# Patient Record
Sex: Female | Born: 1935 | Race: White | Hispanic: No | Smoking: Never smoker
Health system: Southern US, Community
[De-identification: ages and names within clinical notes are randomized; demographics above are authoritative.]

## PROBLEM LIST (undated history)

## (undated) DIAGNOSIS — R531 Weakness: Secondary | ICD-10-CM

## (undated) DIAGNOSIS — I6522 Occlusion and stenosis of left carotid artery: Secondary | ICD-10-CM

## (undated) DIAGNOSIS — D649 Anemia, unspecified: Secondary | ICD-10-CM

## (undated) DIAGNOSIS — R609 Edema, unspecified: Secondary | ICD-10-CM

## (undated) DIAGNOSIS — I10 Essential (primary) hypertension: Secondary | ICD-10-CM

## (undated) DIAGNOSIS — D72829 Elevated white blood cell count, unspecified: Secondary | ICD-10-CM

## (undated) DIAGNOSIS — E871 Hypo-osmolality and hyponatremia: Secondary | ICD-10-CM

## (undated) HISTORY — DX: Hypo-osmolality and hyponatremia: E87.1

## (undated) HISTORY — DX: Anemia, unspecified: D64.9

## (undated) HISTORY — PX: ABDOMINAL HYSTERECTOMY: SHX81

## (undated) HISTORY — DX: Weakness: R53.1

## (undated) HISTORY — DX: Edema, unspecified: R60.9

## (undated) HISTORY — DX: Elevated white blood cell count, unspecified: D72.829

## (undated) HISTORY — DX: Essential (primary) hypertension: I10

## (undated) HISTORY — DX: Occlusion and stenosis of left carotid artery: I65.22

---

## 1997-10-08 ENCOUNTER — Other Ambulatory Visit: Admission: RE | Admit: 1997-10-08 | Discharge: 1997-10-08 | Payer: Self-pay | Admitting: Internal Medicine

## 1997-12-28 ENCOUNTER — Ambulatory Visit (HOSPITAL_COMMUNITY): Admission: RE | Admit: 1997-12-28 | Discharge: 1997-12-28 | Payer: Self-pay | Admitting: Internal Medicine

## 1997-12-28 ENCOUNTER — Encounter: Payer: Self-pay | Admitting: Internal Medicine

## 1999-04-04 ENCOUNTER — Encounter: Admission: RE | Admit: 1999-04-04 | Discharge: 1999-04-04 | Payer: Self-pay | Admitting: Internal Medicine

## 1999-04-14 ENCOUNTER — Ambulatory Visit (HOSPITAL_COMMUNITY): Admission: RE | Admit: 1999-04-14 | Discharge: 1999-04-14 | Payer: Self-pay | Admitting: Internal Medicine

## 1999-10-13 ENCOUNTER — Other Ambulatory Visit: Admission: RE | Admit: 1999-10-13 | Discharge: 1999-10-13 | Payer: Self-pay | Admitting: Internal Medicine

## 1999-10-13 LAB — HM PAP SMEAR

## 2003-11-26 ENCOUNTER — Encounter: Admission: RE | Admit: 2003-11-26 | Discharge: 2003-11-26 | Payer: Self-pay | Admitting: Internal Medicine

## 2003-12-02 ENCOUNTER — Ambulatory Visit (HOSPITAL_COMMUNITY): Admission: RE | Admit: 2003-12-02 | Discharge: 2003-12-02 | Payer: Self-pay | Admitting: Internal Medicine

## 2007-12-13 ENCOUNTER — Encounter: Admission: RE | Admit: 2007-12-13 | Discharge: 2007-12-13 | Payer: Self-pay | Admitting: Internal Medicine

## 2007-12-18 ENCOUNTER — Encounter: Payer: Self-pay | Admitting: Internal Medicine

## 2007-12-18 ENCOUNTER — Ambulatory Visit (HOSPITAL_COMMUNITY): Admission: RE | Admit: 2007-12-18 | Discharge: 2007-12-18 | Payer: Self-pay | Admitting: Internal Medicine

## 2007-12-18 ENCOUNTER — Ambulatory Visit: Payer: Self-pay | Admitting: Vascular Surgery

## 2008-01-09 ENCOUNTER — Encounter: Admission: RE | Admit: 2008-01-09 | Discharge: 2008-01-09 | Payer: Self-pay | Admitting: Internal Medicine

## 2008-04-03 ENCOUNTER — Ambulatory Visit: Payer: Self-pay | Admitting: Internal Medicine

## 2008-05-22 ENCOUNTER — Ambulatory Visit: Payer: Self-pay | Admitting: Internal Medicine

## 2008-07-03 ENCOUNTER — Ambulatory Visit: Payer: Self-pay | Admitting: Internal Medicine

## 2008-12-29 ENCOUNTER — Ambulatory Visit: Payer: Self-pay | Admitting: Internal Medicine

## 2008-12-31 ENCOUNTER — Encounter: Admission: RE | Admit: 2008-12-31 | Discharge: 2008-12-31 | Payer: Self-pay | Admitting: Internal Medicine

## 2008-12-31 LAB — HM MAMMOGRAPHY

## 2009-07-01 ENCOUNTER — Ambulatory Visit: Payer: Self-pay | Admitting: Internal Medicine

## 2009-08-09 ENCOUNTER — Ambulatory Visit: Payer: Self-pay | Admitting: Internal Medicine

## 2009-12-13 HISTORY — PX: EYE SURGERY: SHX253

## 2009-12-31 ENCOUNTER — Ambulatory Visit: Payer: Self-pay | Admitting: Internal Medicine

## 2010-01-10 ENCOUNTER — Ambulatory Visit: Payer: Self-pay | Admitting: Internal Medicine

## 2010-07-05 ENCOUNTER — Ambulatory Visit (INDEPENDENT_AMBULATORY_CARE_PROVIDER_SITE_OTHER): Payer: Medicare Other | Admitting: Internal Medicine

## 2010-07-05 DIAGNOSIS — I10 Essential (primary) hypertension: Secondary | ICD-10-CM

## 2010-07-05 DIAGNOSIS — F068 Other specified mental disorders due to known physiological condition: Secondary | ICD-10-CM

## 2011-01-02 ENCOUNTER — Encounter: Payer: Self-pay | Admitting: Internal Medicine

## 2011-01-03 ENCOUNTER — Ambulatory Visit: Payer: Medicare Other | Admitting: Internal Medicine

## 2011-01-03 ENCOUNTER — Ambulatory Visit (INDEPENDENT_AMBULATORY_CARE_PROVIDER_SITE_OTHER): Payer: Medicare Other | Admitting: Internal Medicine

## 2011-01-03 ENCOUNTER — Encounter: Payer: Self-pay | Admitting: Internal Medicine

## 2011-01-03 DIAGNOSIS — I6522 Occlusion and stenosis of left carotid artery: Secondary | ICD-10-CM | POA: Insufficient documentation

## 2011-01-03 DIAGNOSIS — M1990 Unspecified osteoarthritis, unspecified site: Secondary | ICD-10-CM

## 2011-01-03 DIAGNOSIS — I10 Essential (primary) hypertension: Secondary | ICD-10-CM | POA: Insufficient documentation

## 2011-01-03 DIAGNOSIS — I6529 Occlusion and stenosis of unspecified carotid artery: Secondary | ICD-10-CM

## 2011-01-03 NOTE — Progress Notes (Signed)
  Subjective:    Patient ID: Brooke Boyd, female    DOB: 07-Aug-1917, 77 y.o.   MRN: [id number]  HPI  patient with history of hypertension, 40-60% left carotid stenosis, perhaps mild dementia frequently repeating herself in today for six-month recheck. She is on generic Plendil 10 mg daily. Takes a baby aspirin daily. Used to take HCTZ 25 mg daily and potassium supplement but that was discontinued in August 2011. Had Zostavax vaccine August 2011. Refuses colonoscopy. Declines influenza immunization. Had Pneumovax vaccine February 2011. Last tetanus immunization may 1994.  Has a living will we'll. No known drug allergies. Last mammogram 2009 and has refused further mammography. Hysterectomy without oophorectomy around 1967. Cataract extraction August 2011 history of osteoarthritis of her knee and has had it injected by Dr. supple in 2010. Ambulates with a walker. Lives in an apartment complex in by her son. Has help from [employer] every other week for 3 hours.    Review of Systems     Objective:   Physical Exam chest clear to auscultation, cardiac exam: Regular rate and rhythm, normal S1 and S2, extremities without edema. She is alert and oriented x3. She knows the president of the [place], day of week, month and year. However she repeats herself frequently.        Assessment & Plan:  Hypertension-stable on generic Plendil daily  Osteoarthritis-stable ambulates with walker  Probable mild dementia with repeating herself frequently  Return in 6 months. She really wishes to have very little done in terms of health maintenance he declines influenza immunization. Accompanied by her son today.

## 2011-07-04 ENCOUNTER — Ambulatory Visit (INDEPENDENT_AMBULATORY_CARE_PROVIDER_SITE_OTHER): Payer: Medicare Other | Admitting: Internal Medicine

## 2011-07-04 ENCOUNTER — Encounter: Payer: Self-pay | Admitting: Internal Medicine

## 2011-07-04 DIAGNOSIS — R609 Edema, unspecified: Secondary | ICD-10-CM | POA: Diagnosis not present

## 2011-07-04 DIAGNOSIS — Z23 Encounter for immunization: Secondary | ICD-10-CM | POA: Diagnosis not present

## 2011-07-04 DIAGNOSIS — I10 Essential (primary) hypertension: Secondary | ICD-10-CM

## 2011-07-04 DIAGNOSIS — R5381 Other malaise: Secondary | ICD-10-CM

## 2011-07-04 DIAGNOSIS — R5383 Other fatigue: Secondary | ICD-10-CM | POA: Diagnosis not present

## 2011-07-04 DIAGNOSIS — H9190 Unspecified hearing loss, unspecified ear: Secondary | ICD-10-CM | POA: Insufficient documentation

## 2011-07-04 DIAGNOSIS — M1990 Unspecified osteoarthritis, unspecified site: Secondary | ICD-10-CM

## 2011-07-04 NOTE — Progress Notes (Signed)
Addended by: Judy Pimple on: 07/04/2011 04:41 PM   Modules accepted: Orders

## 2011-07-04 NOTE — Progress Notes (Signed)
  Subjective:    Patient ID: Brooke Boyd, female    DOB: [birth year], 78 y.o.   MRN: [id number]  HPI 78 year old white female in today for six-month recheck. History of hypertension, dependent edema, osteoarthritis. Accompanied by her son today. She lives independently in an apartment. Has been there for 10 years. Does well. Has someone to clean her house. She repeats herself quite a bit but she is alert& oriented x3. Recent issues with hearing loss but reluctant to get hearing aids. Did not want to get influenza immunization. Does have significant issues with dependent edema. Explained her shoes keep her feet elevated. Has been on diuretic in the past but was unable to tolerate that because of low sodium. Son is concerned about lesion on her left lateral leg that has been present for some time. Her skin is quite dry. Patient ambulates with a walker. Has not fallen. Manages her medications and lives independently. Both of her sons are supportive. History of left carotid bruit. Does take baby aspirin daily.    Review of Systems     Objective:   Physical Exam chest clear to auscultation; cardiac exam regular rate and rhythm normal S1 and S2; 1+ pitting edema of lower extremities. Skin is very dry. Has Heberden's and Bouchard's nodes of both hands consistent with osteoarthritis. Has a apparent hematoma left lateral leg which apparently has been there for some time. This could also be senile purpura. She has changes of stasis dermatitis both [place]. Her skin is quite thin.          Assessment & Plan:  Hypertension  History of left carotid bruit  Dependent edema  Hearing loss  Osteoarthritis  Senile purpura  Plan: Patient will return in 6 months for brief physical exam. Tdap vaccine given today. Declines colonoscopy. Declines bone density study.

## 2011-07-04 NOTE — Patient Instructions (Signed)
You had been given Tdap vaccine today. Lab work was drawn for thyroid functions as well as electrolytes. Please keep feet elevated as much as possible to help with lower extremity edema. Continue same medications. Please consider hearing aid. Return in 6 months.

## 2011-07-05 LAB — CBC WITH DIFFERENTIAL/PLATELET
Basophils Absolute: 0 10*3/uL (ref 0.0–0.1)
Basophils Relative: 0 % (ref 0–1)
MCHC: 32.7 g/dL (ref 30.0–36.0)
Monocytes Absolute: 0.6 10*3/uL (ref 0.1–1.0)
Neutro Abs: 3.8 10*3/uL (ref 1.7–7.7)
Neutrophils Relative %: 53 % (ref 43–77)
Platelets: 317 10*3/uL (ref 150–400)
RDW: 12.8 % (ref 11.5–15.5)
WBC: 7.3 10*3/uL (ref 4.0–10.5)

## 2011-07-05 LAB — COMPREHENSIVE METABOLIC PANEL
ALT: 12 U/L (ref 0–35)
AST: 20 U/L (ref 0–37)
Albumin: 3.7 g/dL (ref 3.5–5.2)
Alkaline Phosphatase: 87 U/L (ref 39–117)
BUN: 26 mg/dL — ABNORMAL HIGH (ref 6–23)
Potassium: 4.3 mEq/L (ref 3.5–5.3)
Sodium: 134 mEq/L — ABNORMAL LOW (ref 135–145)

## 2011-10-26 ENCOUNTER — Other Ambulatory Visit: Payer: Self-pay

## 2011-10-26 MED ORDER — FELODIPINE ER 10 MG PO TB24: 10.0000 mg | ORAL_TABLET | Freq: Every day | ORAL | Status: DC

## 2012-01-01 ENCOUNTER — Other Ambulatory Visit: Payer: Medicare Other | Admitting: Internal Medicine

## 2012-01-01 DIAGNOSIS — M810 Age-related osteoporosis without current pathological fracture: Secondary | ICD-10-CM | POA: Diagnosis not present

## 2012-01-01 DIAGNOSIS — I10 Essential (primary) hypertension: Secondary | ICD-10-CM

## 2012-01-01 DIAGNOSIS — Z79899 Other long term (current) drug therapy: Secondary | ICD-10-CM

## 2012-01-01 LAB — COMPREHENSIVE METABOLIC PANEL
ALT: 10 U/L (ref 0–35)
AST: 19 U/L (ref 0–37)
Albumin: 4 g/dL (ref 3.5–5.2)
CO2: 26 mEq/L (ref 19–32)
Calcium: 8.8 mg/dL (ref 8.4–10.5)
Chloride: 102 mEq/L (ref 96–112)
Creat: 0.62 mg/dL (ref 0.50–1.10)
Potassium: 4.3 mEq/L (ref 3.5–5.3)
Sodium: 139 mEq/L (ref 135–145)
Total Protein: 7 g/dL (ref 6.0–8.3)

## 2012-01-01 LAB — LIPID PANEL: Cholesterol: 170 mg/dL (ref 0–200)

## 2012-01-01 LAB — CBC WITH DIFFERENTIAL/PLATELET
Eosinophils Relative: 2 % (ref 0–5)
Lymphocytes Relative: 46 % (ref 12–46)
Lymphs Abs: 2.9 10*3/uL (ref 0.7–4.0)
MCV: 90.8 fL (ref 78.0–100.0)
Neutro Abs: 2.7 10*3/uL (ref 1.7–7.7)
Platelets: 310 10*3/uL (ref 150–400)
RBC: 4 MIL/uL (ref 3.87–5.11)
WBC: 6.3 10*3/uL (ref 4.0–10.5)

## 2012-01-01 LAB — TSH: TSH: 2.7 u[IU]/mL (ref 0.350–4.500)

## 2012-01-02 ENCOUNTER — Encounter: Payer: Self-pay | Admitting: Internal Medicine

## 2012-01-02 ENCOUNTER — Ambulatory Visit (INDEPENDENT_AMBULATORY_CARE_PROVIDER_SITE_OTHER): Payer: Medicare Other | Admitting: Internal Medicine

## 2012-01-02 VITALS — BP 134/78 | HR 96 | Temp 99.1°F | Ht 59.5 in | Wt 144.0 lb

## 2012-01-02 DIAGNOSIS — Z0000 Encounter for general adult medical examination without abnormal findings: Secondary | ICD-10-CM | POA: Diagnosis not present

## 2012-01-02 DIAGNOSIS — H9319 Tinnitus, unspecified ear: Secondary | ICD-10-CM

## 2012-01-02 LAB — POCT URINALYSIS DIPSTICK
Bilirubin, UA: NEGATIVE
Glucose, UA: NEGATIVE
Leukocytes, UA: NEGATIVE
Nitrite, UA: NEGATIVE
Urobilinogen, UA: NEGATIVE

## 2012-01-02 LAB — VITAMIN D 25 HYDROXY (VIT D DEFICIENCY, FRACTURES): Vit D, 25-Hydroxy: 40 ng/mL (ref 30–89)

## 2012-01-02 NOTE — Progress Notes (Signed)
Subjective:    Patient ID: Brooke Boyd, female    DOB: 1917-05-23, 78 y.o.   MRN: [id number]  HPI 78 year old white female with history of hypertension in today for health maintenance exam. She currently lives independently wearing a medic alert device and family members checking on her frequently. She really doesn't appear much food. She has a microwave at home but doesn't really couldn't for herself anymore. Family members bring in food for her. She has [employer] come every 2 weeks to clean her home. She is only had one fall in the past year and that was when she got off balance with her walker and turned it over. She was able to right herself and her walker by herself without any assistance and did not injure herself.  History of left carotid stenosis of 40-60% in 2001. Had similar study 2005 but declined having further carotid Dopplers in 2008. Last mammogram was July 2010 and has refused further mammography. Had Zostavax vaccine August 2011. Had Pneumovax vaccine February 2011. Not sure she had influenza immunization last year. Had tetanus immunization 09/23/1992.  Has had issues with right knee and has had one steroid injection. Ambulates with a walker. She used to be on HCTZ but became hyponatremic so that was discontinued.   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 Completed one year of college. Does not smoke. Very rarely consumes a glass of wine.  Family history: Father died at age 78 of Parkinson's disease. Mother died at age 72 of an MI. One brother died at age 44 of colon cancer. One brother died at age 56 of complications of epilepsy. One sister with history of colon cancer. 2 adult sons in good health. Mother had hypertension.    Review of Systems  Constitutional: Negative.   HENT: Negative.   Eyes: Negative.   Respiratory: Negative.   Cardiovascular: Negative.   Gastrointestinal: Negative.   Neurological:       Very chatty,  complains of tinnitus left ear  Hematological: Negative.   Psychiatric/Behavioral:       Alert and oriented x3       Objective:   Physical Exam  Vitals reviewed. Constitutional: She is oriented to person, place, and time. She appears well-developed and well-nourished. No distress.  HENT:  Head: Normocephalic and atraumatic.  Right Ear: External ear normal.  Left Ear: External ear normal.  Nose: Nose normal.  Mouth/Throat: Oropharynx is clear and moist. No oropharyngeal exudate.  Eyes: Conjunctivae and EOM are normal. Pupils are equal, round, and reactive to light. Right eye exhibits no discharge. Left eye exhibits no discharge. No scleral icterus.  Neck: Neck supple. No JVD present. No thyromegaly present.  Cardiovascular: Normal rate, regular rhythm, normal heart sounds and intact distal pulses.   No murmur heard. Pulmonary/Chest: Breath sounds normal. No respiratory distress. She has no wheezes. She exhibits no tenderness.       Breasts exam declined  Abdominal: Bowel sounds are normal. She exhibits no mass. There is no tenderness. There is no rebound and no guarding.  Genitourinary:       Deferred  Musculoskeletal: She exhibits edema.       1+ pitting edema lower extremities and multiple superficial varicosities  Neurological: She is oriented to person, place, and time. She has normal reflexes. No cranial nerve deficit.       Ambulates slowly with a walker  Skin: Skin is warm and dry. No rash noted. She is not diaphoretic.  Psychiatric: She has a normal mood and affect. Her behavior is normal. Judgment and thought content normal.          Assessment & Plan:  Hypertension  Tinnitus-recommend Lipoflavinoid over-the-counter  Plan: Return in one year or as needed. I am comfortable with her living independently using a med alert device Subjective:   Patient presents for Medicare Annual/Subsequent preventive examination.   Review Past Medical/Family/Social: See above  history   Risk Factors  Current exercise habits: unable to exercise Dietary issues discussed: No dietary concerns  Cardiac risk factors:   Depression Screen  (Note: if answer to either of the following is "Yes", a more complete depression screening is indicated)  Over the past two weeks, have you felt down, depressed or hopeless? no Over the past two weeks, have you felt little interest or pleasure in doing things? no Have you lost interest or pleasure in daily life? no Do you often feel hopeless? no Do you cry easily over simple problems? No   Activities of Daily Living  In your present state of health, do you have any difficulty performing the following activities?:  Driving? yes Managing money? yes Feeding yourself? No  Getting from bed to chair? No  Climbing a flight of stairs? yes Preparing food and eating?: No  Bathing or showering? No - sponge bath Getting dressed: No  Getting to the toilet? No  Using the toilet:No  Moving around from place to place: No  In the past year have you fallen or had a near fall?: one fall in past year with walker- lost balance Are you sexually active? No  Do you have more than one partner? No   Hearing Difficulties: To have hearing test this fall Do you often ask people to speak up or repeat themselves? No  Do you experience ringing or noises in your ears?  yes  Do you have difficulty understanding soft or whispered voices? yes Do you feel that you have a problem with memory? No Do you often misplace items? No  Do you feel safe at home? Yes   Cognitive Testing  Alert? Yes Normal Appearance?Yes  Oriented to person? Yes Place? Yes  Time? Yes  Recall of three objects? Yes  Can perform simple calculations? Yes  Displays appropriate judgment?Yes  Can read the correct time from a watch face?Yes   List the Names of Other Physician/Practitioners you currently use: No   Indicate any recent Medical Services you may have received from  other than [facility] providers in the past year (date may be approximate). None  Screening Tests / Date Colonoscopy  -declined                   Zostavax  Mammogram-declined  Influenza Vaccine  Tetanus/tdap   Objective:       See EPIC Vital Signs    General appearance: Appears younger than stated age  Head: Normocephalic, without obvious abnormality, atraumatic  Eyes: conj clear, EOMi PEERLA  Ears: normal TM's and external ear canals both ears  Nose: Nares normal. Septum midline. Mucosa normal. No drainage or sinus tenderness.  Throat: lips, mucosa, and tongue normal; teeth and gums normal  Neck: no adenopathy, no carotid bruit, no JVD, supple, symmetrical, trachea midline and thyroid not enlarged, symmetric, no tenderness/mass/nodules  No CVA tenderness.  Lungs: clear to auscultation bilaterally  Breasts: Declined Heart: regular rate and rhythm, S1, S2 normal, no murmur, click, rub or gallop  Abdomen: soft, non-tender; bowel sounds normal; no masses,  no organomegaly  Musculoskeletal: ROM normal in all joints, no crepitus, no deformity, Normal muscle strengthen. Back  is symmetric, no curvature. Skin: Skin color, texture, turgor normal. No rashes or lesions  Lymph nodes: Cervical, supraclavicular, and axillary nodes normal.  Neurologic: CN 2 -12 Normal, Normal symmetric reflexes. Normal coordination and gait  Psych: Alert & Oriented x 3, Mood appear stable.    Assessment:    Annual wellness medicare exam   Plan:    During the course of the visit the patient was educated and counseled about appropriate screening and preventive services including:  Screening mammography which she refuses Colorectal cancer screening which she refuses  Screen + for depression is negative    Diet review for nutrition referral? Yes ____ Not Indicated __x__  Patient Instructions (the written plan) was given to the patient.  Medicare Attestation  I have personally reviewed:  The patient's  medical and social history  Their use of alcohol, tobacco or illicit drugs  Their current medications and supplements  The patient's functional ability including ADLs,fall risks, home safety risks, cognitive, and hearing and visual impairment  Diet and physical activities  Evidence for depression or mood disorders  The patient's weight, height, BMI, and visual acuity have been recorded in the chart. I have made referrals, counseling, and provided education to the patient based on review of the above and I have provided the patient with a written personalized care plan for preventive services.

## 2012-01-02 NOTE — Patient Instructions (Addendum)
Continue same meds and return in one year. 

## 2012-01-04 ENCOUNTER — Encounter: Payer: Medicare Other | Admitting: Internal Medicine

## 2012-04-16 DIAGNOSIS — H903 Sensorineural hearing loss, bilateral: Secondary | ICD-10-CM | POA: Diagnosis not present

## 2012-10-14 ENCOUNTER — Other Ambulatory Visit: Payer: Self-pay

## 2012-10-14 MED ORDER — FELODIPINE ER 10 MG PO TB24: 10.0000 mg | ORAL_TABLET | Freq: Every day | ORAL | Status: DC

## 2013-01-06 ENCOUNTER — Other Ambulatory Visit: Payer: Medicare Other | Admitting: Internal Medicine

## 2013-01-06 DIAGNOSIS — M810 Age-related osteoporosis without current pathological fracture: Secondary | ICD-10-CM | POA: Diagnosis not present

## 2013-01-06 DIAGNOSIS — Z130 Encounter for screening for diseases of the blood and blood-forming organs and certain disorders involving the immune mechanism: Secondary | ICD-10-CM

## 2013-01-06 DIAGNOSIS — Z1329 Encounter for screening for other suspected endocrine disorder: Secondary | ICD-10-CM

## 2013-01-06 DIAGNOSIS — I10 Essential (primary) hypertension: Secondary | ICD-10-CM | POA: Diagnosis not present

## 2013-01-06 DIAGNOSIS — Z13220 Encounter for screening for lipoid disorders: Secondary | ICD-10-CM

## 2013-01-06 LAB — COMPREHENSIVE METABOLIC PANEL
Albumin: 4.2 g/dL (ref 3.5–5.2)
Alkaline Phosphatase: 75 U/L (ref 39–117)
BUN: 16 mg/dL (ref 6–23)
Creat: 0.64 mg/dL (ref 0.50–1.10)
Glucose, Bld: 90 mg/dL (ref 70–99)
Potassium: 4.1 mEq/L (ref 3.5–5.3)

## 2013-01-06 LAB — CBC WITH DIFFERENTIAL/PLATELET
Basophils Relative: 0 % (ref 0–1)
Eosinophils Absolute: 0.1 10*3/uL (ref 0.0–0.7)
Hemoglobin: 12.1 g/dL (ref 12.0–15.0)
Lymphs Abs: 3.2 10*3/uL (ref 0.7–4.0)
MCH: 30.9 pg (ref 26.0–34.0)
Monocytes Relative: 8 % (ref 3–12)
Neutro Abs: 3.3 10*3/uL (ref 1.7–7.7)
Neutrophils Relative %: 45 % (ref 43–77)
Platelets: 329 10*3/uL (ref 150–400)
RBC: 3.91 MIL/uL (ref 3.87–5.11)

## 2013-01-06 LAB — LIPID PANEL
HDL: 58 mg/dL (ref >39)
LDL Cholesterol: 90 mg/dL (ref 0–99)
Total CHOL/HDL Ratio: 2.9 Ratio
Triglycerides: 107 mg/dL (ref <150)

## 2013-01-07 ENCOUNTER — Encounter: Payer: Medicare Other | Admitting: Internal Medicine

## 2013-01-09 ENCOUNTER — Encounter: Payer: Self-pay | Admitting: Internal Medicine

## 2013-01-09 ENCOUNTER — Ambulatory Visit (INDEPENDENT_AMBULATORY_CARE_PROVIDER_SITE_OTHER): Payer: Medicare Other | Admitting: Internal Medicine

## 2013-01-09 VITALS — BP 132/64 | HR 88 | Temp 98.7°F | Ht 59.0 in | Wt 134.0 lb

## 2013-01-09 DIAGNOSIS — M19019 Primary osteoarthritis, unspecified shoulder: Secondary | ICD-10-CM | POA: Diagnosis not present

## 2013-01-09 DIAGNOSIS — H9190 Unspecified hearing loss, unspecified ear: Secondary | ICD-10-CM

## 2013-01-09 DIAGNOSIS — M1710 Unilateral primary osteoarthritis, unspecified knee: Secondary | ICD-10-CM | POA: Diagnosis not present

## 2013-01-09 DIAGNOSIS — H9193 Unspecified hearing loss, bilateral: Secondary | ICD-10-CM

## 2013-01-09 DIAGNOSIS — Z23 Encounter for immunization: Secondary | ICD-10-CM | POA: Diagnosis not present

## 2013-01-09 DIAGNOSIS — I10 Essential (primary) hypertension: Secondary | ICD-10-CM

## 2013-01-09 DIAGNOSIS — M170 Bilateral primary osteoarthritis of knee: Secondary | ICD-10-CM

## 2013-01-09 DIAGNOSIS — R8290 Unspecified abnormal findings in urine: Secondary | ICD-10-CM

## 2013-01-09 DIAGNOSIS — R82998 Other abnormal findings in urine: Secondary | ICD-10-CM | POA: Diagnosis not present

## 2013-01-09 DIAGNOSIS — F0390 Unspecified dementia without behavioral disturbance: Secondary | ICD-10-CM

## 2013-01-09 LAB — POCT URINALYSIS DIPSTICK
Blood, UA: NEGATIVE
Glucose, UA: NEGATIVE
Ketones, UA: NEGATIVE
Spec Grav, UA: 1.02

## 2013-01-09 NOTE — Progress Notes (Signed)
Subjective:    Patient ID: Brooke Boyd, female    DOB: 02/23/1918, 79 y.o.   MRN: [id number]  HPI  79 year old White female for health maintenance and evaluation of medical problems. Continues to live alone in an apartment owned by one of her sons. She wears a medic alert device and family members check on her frequently. She has [employer] come to clean her home. One fall in the past year. Was not injured in fall. Fall was inside her home near her closet. She has a microwave at home but doesn't use stove anymore. Family brings her food. No longer drives. Accompanied today by her son.  Past medical history: History of left carotid stenosis of 40-60% in 2001. Had similar study in 2005 but declined having further carotid Dopplers in 2008. Last mammogram was July 2010 and has refused further mammography.  Had Zostavax vaccine August 2011. Pneumovax vaccine February 2011. Tetanus immunization 1994.  Has had issues with right knee osteoarthritis and has had one steroid injection. Ambulates with a walker.  She used to be on HCTZ for hypertension but became hyponatremic so that was discontinued. Has had some issues with right shoulder and is seen orthopedist and had steroid injection. Did not help very much.  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 Completed one year of college. Does not smoke. Very rarely consumes a glass of wine.  Family history: Father died at age 29 of Parkinson's disease. Mother died at age 61 of an MI. One brother died at age 62 of colon cancer. One brother died at age 23 of complications of epilepsy. One sister with history of colon cancer. 2 adult sons in good health. Mother had hypertension.    Review of Systems  Constitutional: Negative.   HENT: Negative.   Eyes: Negative.   Respiratory: Negative.   Cardiovascular: Negative.   Gastrointestinal: Negative.   Endocrine: Negative.   Genitourinary: Negative.    Musculoskeletal:       Right shoulder pain and knee pain  Skin:       Abraded areas on scalp  Allergic/Immunologic: Negative.   Neurological: Negative.   Hematological: Negative.   Psychiatric/Behavioral: Negative.        Objective:   Physical Exam  Vitals reviewed. Constitutional: She is oriented to person, place, and time. She appears well-developed and well-nourished. No distress.  HENT:  Head: Normocephalic and atraumatic.  Right Ear: External ear normal.  Left Ear: External ear normal.  Mouth/Throat: Oropharynx is clear and moist. No oropharyngeal exudate.  Cerumen in right ear canal  Eyes: Conjunctivae are normal. Right eye exhibits no discharge. Left eye exhibits no discharge. No scleral icterus.  Neck: Neck supple. No JVD present. No thyromegaly present.  Cardiovascular: Normal rate, regular rhythm, normal heart sounds and intact distal pulses.   No murmur heard. Pulmonary/Chest: Effort normal and breath sounds normal. No respiratory distress. She has no wheezes. She has no rales. She exhibits no tenderness.  Breasts without masses  Abdominal: Soft. Bowel sounds are normal. She exhibits no distension and no mass. There is no tenderness. There is no rebound and no guarding.  Genitourinary:  Deferred due to age  Musculoskeletal: She exhibits no edema.  Lymphadenopathy:    She has no cervical adenopathy.  Neurological: She is alert and oriented to person, place, and time. She has normal reflexes. She displays normal reflexes. No cranial nerve deficit. Coordination normal.  Skin: Skin is warm and dry.  No rash noted. She is not diaphoretic.  Abraded areas on scalp  Psychiatric: She has a normal mood and affect. Her behavior is normal. Judgment and thought content normal.  Repeats herself a lot in conversation          Assessment & Plan:  HTN-stable Hearing loss followed by ENT Osteoarthritis-stable ambulates with a walker. Only one minor fall in past year in  apartment Health maintenance did agree to Influenza immunization but declines mammogram and colon cancer screening at her age which is reasonable Right shoulder arthropathy- has seen ortho in past and had steroid injection which did not help much Scalp abrasions- etiology unclear Mild dementia- son reports she forgets sometimes but still can reconcile a bank statement. Did not recall where my office was but has not been here in a year. Repeats herself a lot in conversation. Lives alone and seems safe with help from family and [employer]. Plan: RTC one year or prn. Form signed for handicapped parking permit. Son accompanies her today. Was told if abraded areas on scalp do not heal within the next month to have Dermatology evaluation.  Subjective:   Patient presents for Medicare Annual/Subsequent preventive examination.   Review Past Medical/Family/Social: see above   Risk Factors  Current exercise habits: sedentary Dietary issues discussed: low fat low carb  Cardiac risk factors: HTN  Depression Screen  (Note: if answer to either of the following is "Yes", a more complete depression screening is indicated)   Over the past two weeks, have you felt down, depressed or hopeless? No  Over the past two weeks, have you felt little interest or pleasure in doing things? No Have you lost interest or pleasure in daily life? No Do you often feel hopeless? No Do you cry easily over simple problems? No   Activities of Daily Living  In your present state of health, do you have any difficulty performing the following activities?:   Driving? Not driving Managing money? No  Can reconcile bank statement Feeding yourself? No  Getting from bed to chair? No  Climbing a flight of stairs? No  Preparing food and eating?: No  Bathing or showering? No  Getting dressed: No  Getting to the toilet? No  Using the toilet:No  Moving around from place to place: No  In the past year have you fallen or had  a near fall?:No  Are you sexually active? No  Do you have more than one partner? No   Hearing Difficulties: yes Do you often ask people to speak up or repeat themselves? yes  Do you experience ringing or noises in your ears? Not any more  Do you have difficulty understanding soft or whispered voices? yes Do you feel that you have a problem with memory? some Do you often misplace items? sometimes   Home Safety:  Do you have a smoke alarm at your residence? Yes Do you have grab bars in the bathroom? yes Do you have throw rugs in your house? yes   Cognitive Testing  Alert? Yes Normal Appearance?Yes  Oriented to person? Yes Place? Yes  Time? Yes  Recall of three objects? Yes  Can perform simple calculations? Yes  Displays appropriate judgment?Yes  Can read the correct time from a watch face?Yes   List the Names of Other Physician/Practitioners you currently use:  See referral list for the physicians patient is currently seeing. Does not wear hearing aids. Sees ENT for hearing.    Review of Systems: see above  Objective:     General appearance: Appears stated age. Head: Normocephalic, without obvious abnormality, atraumatic  Eyes: conj clear, EOMi PEERLA  Ears: normal TM's and external ear canals both ears  Nose: Nares normal. Septum midline. Mucosa normal. No drainage or sinus tenderness.  Throat: lips, mucosa, and tongue normal; teeth and gums normal  Neck: no adenopathy, no carotid bruit, no JVD, supple, symmetrical, trachea midline and thyroid not enlarged, symmetric, no tenderness/mass/nodules  No CVA tenderness.  Lungs: clear to auscultation bilaterally  Breasts: normal appearance, no masses or tenderness. Heart: regular rate and rhythm, S1, S2 normal, no murmur, click, rub or gallop  Abdomen: soft, non-tender; bowel sounds normal; no masses, no organomegaly  Musculoskeletal: ROM normal in all joints, no crepitus, no deformity, Normal muscle strengthen. Back  is  symmetric, no curvature. Skin: Skin color, texture, turgor normal. Abraded areas on scalp Lymph nodes: Cervical, supraclavicular, and axillary nodes normal.  Neurologic: CN 2 -12 Normal, Normal symmetric reflexes. Normal coordination and gait  Psych: Alert & Oriented x 3, Mood appear stable.    Assessment:    Annual wellness medicare exam   Plan:    During the course of the visit the patient was educated and counseled about appropriate screening and preventive services including:   Declines mammogram Did agree to Influenza vaccine this year Do not pursue colon cancer screening at her age- she does not want to do hemoccult cards and has declined colonoscopy previously     Patient Instructions (the written plan) was given to the patient.  Medicare Attestation  I have personally reviewed:  The patient's medical and social history  Their use of alcohol, tobacco or illicit drugs  Their current medications and supplements  The patient's functional ability including ADLs,fall risks, home safety risks, cognitive, and hearing and visual impairment  Diet and physical activities  Evidence for depression or mood disorders  The patient's weight, height, BMI, and visual acuity have been recorded in the chart. I have made referrals, counseling, and provided education to the patient based on review of the above and I have provided the patient with a written personalized care plan for preventive services.

## 2013-01-09 NOTE — Patient Instructions (Addendum)
Continue same meds and return in one year.   Influenza vaccine given today.

## 2013-01-10 LAB — URINE CULTURE: Colony Count: 8000

## 2013-01-11 ENCOUNTER — Encounter: Payer: Self-pay | Admitting: Internal Medicine

## 2013-01-15 NOTE — Progress Notes (Signed)
Patient's family informed.

## 2013-02-11 DIAGNOSIS — L738 Other specified follicular disorders: Secondary | ICD-10-CM | POA: Diagnosis not present

## 2013-02-11 DIAGNOSIS — D044 Carcinoma in situ of skin of scalp and neck: Secondary | ICD-10-CM | POA: Diagnosis not present

## 2013-02-11 DIAGNOSIS — D485 Neoplasm of uncertain behavior of skin: Secondary | ICD-10-CM | POA: Diagnosis not present

## 2013-03-24 DIAGNOSIS — D044 Carcinoma in situ of skin of scalp and neck: Secondary | ICD-10-CM | POA: Diagnosis not present

## 2013-04-23 DIAGNOSIS — H903 Sensorineural hearing loss, bilateral: Secondary | ICD-10-CM | POA: Diagnosis not present

## 2013-07-15 DIAGNOSIS — D044 Carcinoma in situ of skin of scalp and neck: Secondary | ICD-10-CM | POA: Diagnosis not present

## 2014-01-13 ENCOUNTER — Other Ambulatory Visit: Payer: Medicare Other | Admitting: Internal Medicine

## 2014-01-13 DIAGNOSIS — Z130 Encounter for screening for diseases of the blood and blood-forming organs and certain disorders involving the immune mechanism: Secondary | ICD-10-CM | POA: Diagnosis not present

## 2014-01-13 DIAGNOSIS — Z1329 Encounter for screening for other suspected endocrine disorder: Secondary | ICD-10-CM

## 2014-01-13 DIAGNOSIS — Z13220 Encounter for screening for lipoid disorders: Secondary | ICD-10-CM | POA: Diagnosis not present

## 2014-01-13 DIAGNOSIS — I10 Essential (primary) hypertension: Secondary | ICD-10-CM | POA: Diagnosis not present

## 2014-01-13 LAB — LIPID PANEL
Cholesterol: 161 mg/dL (ref 0–200)
HDL: 58 mg/dL (ref >39)
LDL CALC: 86 mg/dL (ref 0–99)
Total CHOL/HDL Ratio: 2.8 Ratio
Triglycerides: 83 mg/dL (ref <150)
VLDL: 17 mg/dL (ref 0–40)

## 2014-01-13 LAB — COMPREHENSIVE METABOLIC PANEL
ALT: 9 U/L (ref 0–35)
AST: 17 U/L (ref 0–37)
Albumin: 3.7 g/dL (ref 3.5–5.2)
Alkaline Phosphatase: 75 U/L (ref 39–117)
BUN: 11 mg/dL (ref 6–23)
CALCIUM: 8.8 mg/dL (ref 8.4–10.5)
CHLORIDE: 99 meq/L (ref 96–112)
CO2: 28 meq/L (ref 19–32)
CREATININE: 0.64 mg/dL (ref 0.50–1.10)
Glucose, Bld: 88 mg/dL (ref 70–99)
POTASSIUM: 4 meq/L (ref 3.5–5.3)
SODIUM: 134 meq/L — AB (ref 135–145)
TOTAL PROTEIN: 6.6 g/dL (ref 6.0–8.3)
Total Bilirubin: 0.4 mg/dL (ref 0.2–1.2)

## 2014-01-13 LAB — CBC WITH DIFFERENTIAL/PLATELET
BASOS ABS: 0 10*3/uL (ref 0.0–0.1)
BASOS PCT: 0 % (ref 0–1)
EOS ABS: 0.1 10*3/uL (ref 0.0–0.7)
EOS PCT: 1 % (ref 0–5)
HEMATOCRIT: 37.1 % (ref 36.0–46.0)
Hemoglobin: 12.5 g/dL (ref 12.0–15.0)
LYMPHS PCT: 53 % — AB (ref 12–46)
Lymphs Abs: 3.4 10*3/uL (ref 0.7–4.0)
MCH: 31.2 pg (ref 26.0–34.0)
MCHC: 33.7 g/dL (ref 30.0–36.0)
MCV: 92.5 fL (ref 78.0–100.0)
MONO ABS: 0.4 10*3/uL (ref 0.1–1.0)
Monocytes Relative: 6 % (ref 3–12)
Neutro Abs: 2.6 10*3/uL (ref 1.7–7.7)
Neutrophils Relative %: 40 % — ABNORMAL LOW (ref 43–77)
PLATELETS: 305 10*3/uL (ref 150–400)
RBC: 4.01 MIL/uL (ref 3.87–5.11)
RDW: 14 % (ref 11.5–15.5)
WBC: 6.4 10*3/uL (ref 4.0–10.5)

## 2014-01-14 LAB — TSH: TSH: 3.192 u[IU]/mL (ref 0.350–4.500)

## 2014-01-15 ENCOUNTER — Encounter: Payer: Self-pay | Admitting: Internal Medicine

## 2014-01-15 ENCOUNTER — Ambulatory Visit (INDEPENDENT_AMBULATORY_CARE_PROVIDER_SITE_OTHER): Payer: Medicare Other | Admitting: Internal Medicine

## 2014-01-15 VITALS — BP 118/62 | HR 80 | Ht 59.0 in | Wt 125.0 lb

## 2014-01-15 DIAGNOSIS — Z0000 Encounter for general adult medical examination without abnormal findings: Secondary | ICD-10-CM

## 2014-01-15 DIAGNOSIS — I10 Essential (primary) hypertension: Secondary | ICD-10-CM | POA: Diagnosis not present

## 2014-01-15 DIAGNOSIS — M1711 Unilateral primary osteoarthritis, right knee: Secondary | ICD-10-CM

## 2014-01-15 DIAGNOSIS — H9190 Unspecified hearing loss, unspecified ear: Secondary | ICD-10-CM

## 2014-01-15 DIAGNOSIS — H9193 Unspecified hearing loss, bilateral: Secondary | ICD-10-CM

## 2014-01-15 DIAGNOSIS — R0602 Shortness of breath: Secondary | ICD-10-CM | POA: Diagnosis not present

## 2014-01-15 DIAGNOSIS — R609 Edema, unspecified: Secondary | ICD-10-CM | POA: Diagnosis not present

## 2014-01-15 DIAGNOSIS — M19019 Primary osteoarthritis, unspecified shoulder: Secondary | ICD-10-CM

## 2014-01-15 DIAGNOSIS — M12811 Other specific arthropathies, not elsewhere classified, right shoulder: Secondary | ICD-10-CM

## 2014-01-15 DIAGNOSIS — Z23 Encounter for immunization: Secondary | ICD-10-CM

## 2014-01-15 DIAGNOSIS — M1710 Unilateral primary osteoarthritis, unspecified knee: Secondary | ICD-10-CM | POA: Diagnosis not present

## 2014-01-15 LAB — POCT URINALYSIS DIPSTICK
BILIRUBIN UA: NEGATIVE
Glucose, UA: NEGATIVE
Ketones, UA: NEGATIVE
NITRITE UA: NEGATIVE
PH UA: 6.5
PROTEIN UA: NEGATIVE
RBC UA: NEGATIVE
Spec Grav, UA: 1.02
Urobilinogen, UA: NEGATIVE

## 2014-01-15 LAB — BRAIN NATRIURETIC PEPTIDE: Brain Natriuretic Peptide: 26.2 pg/mL (ref 0.0–100.0)

## 2014-01-15 MED ORDER — HYDROCHLOROTHIAZIDE 25 MG PO TABS: 25.0000 mg | ORAL_TABLET | Freq: Every day | ORAL | Status: DC

## 2014-01-15 NOTE — Patient Instructions (Addendum)
Restart HCTZ and return in 8 weeks. Check and for hyponatremia.

## 2014-01-15 NOTE — Progress Notes (Signed)
Subjective:    Patient ID: Brooke Boyd, female    DOB: April 08, 1918, 80 y.o.   MRN: [id number]  HPI 80 year old White Female in today for health maintenance  and evaluation of medical issues. She is accompanied by her son. She continues to reside alone which is amazing. Has help from family. In 2014 was seen in [place] with right rotator cuff cuff arthropathy and received injection in right shoulder which helped. History of hypertension. No falls in the past year. One fallen 2013.  History of left carotid stenosis of 40-60% in 2001. Had similar study in 2005 but declined having further carotid Dopplers in 2008. Last mammogram was July 2010 and has refused further mammography. Zostavax vaccine August 2011. Pneumovax vaccine February 2011. Tetanus immunization 1994.  Has had issues with right knee and has had one steroid injection. Ambulates with a walker. She is to be on HCTZ for hypertension but became hyponatremic so that was discontinued.  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 Completed one year of college. Does not smoke.  Consumes a glass of wine.  Family history: Father died at age 47 of Parkinson's disease. Mother died at age 90 of an MI. One brother died at age 35 of colon cancer. One brother died at age 57 of complications of epilepsy. One sister with history of colon cancer. 2 adult sons in good health. Mother had hypertension.    Review of Systems  HENT:       Hearing loss  Psychiatric/Behavioral:       Mildly demented and forgetful  All other systems reviewed and are negative.      Objective:   Physical Exam  Vitals reviewed. Constitutional: She is oriented to person, place, and time. She appears well-developed and well-nourished. No distress.  HENT:  Head: Normocephalic and atraumatic.  Right Ear: External ear normal.  Left Ear: External ear normal.  Mouth/Throat: Oropharynx is clear and moist. No  oropharyngeal exudate.  Eyes: Conjunctivae and EOM are normal. Pupils are equal, round, and reactive to light. Right eye exhibits no discharge. Left eye exhibits no discharge. No scleral icterus.  Neck: Neck supple. No JVD present. No thyromegaly present.  Cardiovascular: Normal rate, regular rhythm and normal heart sounds.   No murmur heard. Pulmonary/Chest: Effort normal and breath sounds normal. No respiratory distress. She has no wheezes. She has no rales.  Breasts not examined  Abdominal: Soft. Bowel sounds are normal. She exhibits no distension and no mass. There is no tenderness. There is no guarding.  Genitourinary:  Deferred  Musculoskeletal: She exhibits edema.  Lymphadenopathy:    She has no cervical adenopathy.  Neurological: She is alert and oriented to person, place, and time. She has normal reflexes. No cranial nerve deficit.  Skin: Skin is warm and dry. She is not diaphoretic.  Psychiatric: She has a normal mood and affect. Her behavior is normal. Judgment and thought content normal.  Repeats herself a lot          Assessment & Plan:   Hypertension-stable  Hearing loss  Osteoarthritis  History of right rotator cuff arthropathy  Dependent edema  Plan: Restart HCTZ and return in 8 weeks. We'll have to check for hyponatremia. This is been an issue in the past.    Subjective:   Patient presents for Medicare Annual/Subsequent preventive examination.  Review Past Medical/Family/Social: see above   Risk Factors  Current exercise habits: sedentary Dietary issues discussed: low fat low carb  Cardiac risk factors: HTN  Depression Screen  (Note: if answer to either of the following is "Yes", a more complete depression screening is indicated)   Over the past two weeks, have you felt down, depressed or hopeless? No  Over the past two weeks, have you felt little interest or pleasure in doing things? No Have you lost interest or pleasure in daily life? No Do  you often feel hopeless? No Do you cry easily over simple problems? No   Activities of Daily Living  In your present state of health, do you have any difficulty performing the following activities?:   Driving? No longer drives Managing money? No  Feeding yourself? No  Getting from bed to chair? No  Climbing a flight of stairs? No, limited Preparing food and eating?: No  Bathing or showering? No  Getting dressed:yes Getting to the toilet? No  Using the toilet:No  Moving around from place to place: No  In the past year have you fallen or had a near fall?:No  Are you sexually active? No  Do you have more than one partner? No   Hearing Difficulties: No  Do you often ask people to speak up or repeat themselves? yes Do you experience ringing or noises in your ears? No  Do you have difficulty understanding soft or whispered voices? yes  Do you feel that you have a problem with memory? No Do you often misplace items? No    Home Safety:  Do you have a smoke alarm at your residence? Yes Do you have grab bars in the bathroom? no Do you have throw rugs in your house? no   Cognitive Testing  Alert? Yes Normal Appearance?Yes  Oriented to person? Yes Place? Yes  Time? Yes  Recall of three objects? Yes  Can perform simple calculations? Yes  Displays appropriate judgment?Yes  Can read the correct time from a watch face?Yes   List the Names of Other Physician/Practitioners you currently use:  See referral list for the physicians patient is currently seeing.  Has been to [facility] in the past for right rotator arthropathy   Review of Systems: see above   Objective:     General appearance: Appears stated age this Head: Normocephalic, without obvious abnormality, atraumatic  Eyes: conj clear, EOMi PEERLA  Ears: normal TM's and external ear canals both ears  Nose: Nares normal. Septum midline. Mucosa normal. No drainage or sinus tenderness.  Throat: lips, mucosa,  and tongue normal; teeth and gums normal  Neck: no adenopathy, no carotid bruit, no JVD, supple, symmetrical, trachea midline and thyroid not enlarged, symmetric, no tenderness/mass/nodules  No CVA tenderness.  Lungs: clear to auscultation bilaterally  Breasts: normal appearance, no masses or tenderness Heart: regular rate and rhythm, S1, S2 normal, no murmur, click, rub or gallop  Abdomen: soft, non-tender; bowel sounds normal; no masses, no organomegaly  Musculoskeletal: ROM normal in all joints, no crepitus, no deformity, Normal muscle strengthen. Back  is symmetric, no curvature. Skin: Skin color, texture, turgor normal. No rashes or lesions  Lymph nodes: Cervical, supraclavicular, and axillary nodes normal.  Neurologic: CN 2 -12 Normal, Normal symmetric reflexes. Normal coordination and gait  Psych: Alert & Oriented x 3, Mood appear stable.    Assessment:    Annual wellness medicare exam   Plan:    During the course of the visit the patient was educated and counseled about appropriate screening and preventive services including:   Refuses mammogram  Not suitable for colonoscopy  Patient Instructions (the written plan) was given to the patient.  Medicare Attestation  I have personally reviewed:  The patient's medical and social history  Their use of alcohol, tobacco or illicit drugs  Their current medications and supplements  The patient's functional ability including ADLs,fall risks, home safety risks, cognitive, and hearing and visual impairment  Diet and physical activities  Evidence for depression or mood disorders  The patient's weight, height, BMI, and visual acuity have been recorded in the chart. I have made referrals, counseling, and provided education to the patient based on review of the above and I have provided the patient with a written personalized care plan for preventive services.

## 2014-02-11 ENCOUNTER — Encounter: Payer: Self-pay | Admitting: Internal Medicine

## 2014-03-23 ENCOUNTER — Encounter: Payer: Self-pay | Admitting: Internal Medicine

## 2014-03-23 ENCOUNTER — Ambulatory Visit (INDEPENDENT_AMBULATORY_CARE_PROVIDER_SITE_OTHER): Payer: Medicare Other | Admitting: Internal Medicine

## 2014-03-23 VITALS — BP 110/70 | HR 106 | Temp 97.7°F | Wt 128.0 lb

## 2014-03-23 DIAGNOSIS — F03A0 Unspecified dementia, mild, without behavioral disturbance, psychotic disturbance, mood disturbance, and anxiety: Secondary | ICD-10-CM

## 2014-03-23 DIAGNOSIS — E876 Hypokalemia: Secondary | ICD-10-CM

## 2014-03-23 DIAGNOSIS — R609 Edema, unspecified: Secondary | ICD-10-CM | POA: Diagnosis not present

## 2014-03-23 DIAGNOSIS — F0390 Unspecified dementia without behavioral disturbance: Secondary | ICD-10-CM | POA: Diagnosis not present

## 2014-03-23 DIAGNOSIS — I10 Essential (primary) hypertension: Secondary | ICD-10-CM | POA: Diagnosis not present

## 2014-03-23 LAB — BASIC METABOLIC PANEL
BUN: 13 mg/dL (ref 6–23)
CHLORIDE: 99 meq/L (ref 96–112)
CO2: 25 mEq/L (ref 19–32)
Calcium: 8.8 mg/dL (ref 8.4–10.5)
Creat: 0.71 mg/dL (ref 0.50–1.10)
GLUCOSE: 118 mg/dL — AB (ref 70–99)
Potassium: 3.5 mEq/L (ref 3.5–5.3)
Sodium: 137 mEq/L (ref 135–145)

## 2014-03-24 ENCOUNTER — Other Ambulatory Visit: Payer: Self-pay

## 2014-03-24 ENCOUNTER — Telehealth: Payer: Self-pay

## 2014-03-24 ENCOUNTER — Ambulatory Visit: Payer: Medicare Other | Admitting: Internal Medicine

## 2014-03-24 MED ORDER — POTASSIUM CHLORIDE ER 10 MEQ PO CPCR: 10.0000 meq | ORAL_CAPSULE | Freq: Every day | ORAL | Status: DC

## 2014-03-24 NOTE — Telephone Encounter (Signed)
Left message for patient family member to call office regarding follow up appointment and medications.  Appt scheduled 04/14/2014 at 1145 for lab work and office visit.

## 2014-03-24 NOTE — Telephone Encounter (Signed)
-----   Message from Elby Showers, MD sent at 03/24/2014 10:40 AM EST ----- Please call son. Needs Micro K 10 meq daily to take with fluid pill. Continue fluid pill and recheck basic metabolic panel without OV in 2 weeks.

## 2014-03-24 NOTE — Telephone Encounter (Signed)
Patient family aware of potassium results and medication.  Follow up lab appt 04/14/2014

## 2014-04-14 ENCOUNTER — Other Ambulatory Visit: Payer: Medicare Other | Admitting: Internal Medicine

## 2014-04-14 DIAGNOSIS — I10 Essential (primary) hypertension: Secondary | ICD-10-CM | POA: Diagnosis not present

## 2014-04-14 LAB — BASIC METABOLIC PANEL
BUN: 12 mg/dL (ref 6–23)
CALCIUM: 9 mg/dL (ref 8.4–10.5)
CO2: 25 mEq/L (ref 19–32)
Chloride: 100 mEq/L (ref 96–112)
Creat: 0.66 mg/dL (ref 0.50–1.10)
GLUCOSE: 108 mg/dL — AB (ref 70–99)
Potassium: 4.1 mEq/L (ref 3.5–5.3)
Sodium: 135 mEq/L (ref 135–145)

## 2014-04-15 ENCOUNTER — Telehealth: Payer: Self-pay

## 2014-04-15 NOTE — Telephone Encounter (Signed)
Patient daughter in law informed of BMET results.  She does still have concerns about leg/ankle swelling.  Offered appointment but denied.  Advised patient to take medication and elevate legs as much as possible.

## 2014-04-15 NOTE — Telephone Encounter (Signed)
-----   Message from Elby Showers, MD sent at 04/15/2014 12:41 PM EST ----- Call son. Basic metabolic panel is normal including potassium ----- Message -----    From: Lab in [facility]    Sent: 04/14/2014  10:03 PM      To: Elby Showers, MD

## 2014-04-24 ENCOUNTER — Other Ambulatory Visit: Payer: Self-pay

## 2014-04-24 MED ORDER — POTASSIUM CHLORIDE ER 10 MEQ PO CPCR: 10.0000 meq | ORAL_CAPSULE | Freq: Every day | ORAL | Status: DC

## 2014-07-05 NOTE — Progress Notes (Signed)
   Subjective:    Patient ID: Brooke Boyd, female    DOB: 06/28/1917, 81 y.o.   MRN: [id number]  HPI  Follow-up on dependent edema. At last visit started on diuretic and she's here for follow-up. History of essential hypertension and mild dementia. Lower extremity edema has improved. Blood pressure stable.    Review of Systems     Objective:   Physical Exam  Skin warm and dry. Chest clear to auscultation. No JVD thyromegaly. Cardiac exam: regular rate and rhythm normal S1 and S2. Extremities: trace lower extremity edema.       Assessment & Plan:   Dependent edema -improved with diuretic  History of hyponatremia on diuretic and S1. Previously but then she developed worsening dependent edema so we restarted it. Basic metabolic panel drawn today  Essential hypertension  Mild dementia  Hypokalemia on diuretic-start Micro-K Extentabs 10 mEq daily and follow-up in December  Plan: Return Spring 2016

## 2014-07-05 NOTE — Patient Instructions (Signed)
Return April 2016 for six-month follow-up and basic metabolic panel.

## 2015-01-17 ENCOUNTER — Other Ambulatory Visit: Payer: Self-pay | Admitting: Internal Medicine

## 2015-01-28 ENCOUNTER — Other Ambulatory Visit: Payer: Self-pay | Admitting: Internal Medicine

## 2015-02-15 ENCOUNTER — Other Ambulatory Visit: Payer: Medicare Other | Admitting: Internal Medicine

## 2015-02-15 ENCOUNTER — Other Ambulatory Visit: Payer: Self-pay | Admitting: Internal Medicine

## 2015-02-15 DIAGNOSIS — Z136 Encounter for screening for cardiovascular disorders: Secondary | ICD-10-CM | POA: Diagnosis not present

## 2015-02-15 DIAGNOSIS — Z13220 Encounter for screening for lipoid disorders: Secondary | ICD-10-CM | POA: Diagnosis not present

## 2015-02-15 DIAGNOSIS — R5383 Other fatigue: Secondary | ICD-10-CM

## 2015-02-15 DIAGNOSIS — Z79899 Other long term (current) drug therapy: Secondary | ICD-10-CM

## 2015-02-15 DIAGNOSIS — E559 Vitamin D deficiency, unspecified: Secondary | ICD-10-CM

## 2015-02-15 DIAGNOSIS — R634 Abnormal weight loss: Secondary | ICD-10-CM | POA: Diagnosis not present

## 2015-02-15 LAB — CBC WITH DIFFERENTIAL/PLATELET
BASOS ABS: 0.1 10*3/uL (ref 0.0–0.1)
Basophils Relative: 1 % (ref 0–1)
EOS PCT: 5 % (ref 0–5)
Eosinophils Absolute: 0.3 10*3/uL (ref 0.0–0.7)
HEMATOCRIT: 34.5 % — AB (ref 36.0–46.0)
HEMOGLOBIN: 12 g/dL (ref 12.0–15.0)
LYMPHS ABS: 3.4 10*3/uL (ref 0.7–4.0)
LYMPHS PCT: 51 % — AB (ref 12–46)
MCH: 30.9 pg (ref 26.0–34.0)
MCHC: 34.8 g/dL (ref 30.0–36.0)
MCV: 88.9 fL (ref 78.0–100.0)
MPV: 8.4 fL — AB (ref 8.6–12.4)
Monocytes Absolute: 0.5 10*3/uL (ref 0.1–1.0)
Monocytes Relative: 7 % (ref 3–12)
NEUTROS ABS: 2.4 10*3/uL (ref 1.7–7.7)
Neutrophils Relative %: 36 % — ABNORMAL LOW (ref 43–77)
Platelets: 320 10*3/uL (ref 150–400)
RBC: 3.88 MIL/uL (ref 3.87–5.11)
RDW: 13.5 % (ref 11.5–15.5)
WBC: 6.7 10*3/uL (ref 4.0–10.5)

## 2015-02-15 LAB — LIPID PANEL
Cholesterol: 175 mg/dL (ref 125–200)
HDL: 60 mg/dL (ref >=46)
LDL Cholesterol: 97 mg/dL (ref <130)
Total CHOL/HDL Ratio: 2.9 Ratio (ref <=5.0)
Triglycerides: 92 mg/dL (ref <150)
VLDL: 18 mg/dL (ref <30)

## 2015-02-15 LAB — COMPLETE METABOLIC PANEL WITH GFR
ALBUMIN: 3.7 g/dL (ref 3.6–5.1)
ALK PHOS: 75 U/L (ref 33–130)
ALT: 9 U/L (ref 6–29)
AST: 17 U/L (ref 10–35)
BILIRUBIN TOTAL: 0.5 mg/dL (ref 0.2–1.2)
BUN: 15 mg/dL (ref 7–25)
CALCIUM: 8.9 mg/dL (ref 8.6–10.4)
CHLORIDE: 97 mmol/L — AB (ref 98–110)
CO2: 29 mmol/L (ref 20–31)
CREATININE: 0.67 mg/dL (ref 0.60–0.88)
GFR, EST AFRICAN AMERICAN: 86 mL/min (ref >=60)
GFR, Est Non African American: 75 mL/min (ref >=60)
Glucose, Bld: 92 mg/dL (ref 65–99)
Potassium: 3.4 mmol/L — ABNORMAL LOW (ref 3.5–5.3)
Sodium: 136 mmol/L (ref 135–146)
TOTAL PROTEIN: 7 g/dL (ref 6.1–8.1)

## 2015-02-15 LAB — TSH: TSH: 3.643 u[IU]/mL (ref 0.350–4.500)

## 2015-02-16 LAB — VITAMIN D 25 HYDROXY (VIT D DEFICIENCY, FRACTURES): Vit D, 25-Hydroxy: 41 ng/mL (ref 30–100)

## 2015-02-18 ENCOUNTER — Encounter: Payer: Medicare Other | Admitting: Internal Medicine

## 2015-02-19 ENCOUNTER — Ambulatory Visit (INDEPENDENT_AMBULATORY_CARE_PROVIDER_SITE_OTHER): Payer: Medicare Other | Admitting: Internal Medicine

## 2015-02-19 ENCOUNTER — Encounter: Payer: Self-pay | Admitting: Internal Medicine

## 2015-02-19 VITALS — BP 122/72 | HR 68 | Temp 97.2°F | Ht 59.0 in | Wt 111.0 lb

## 2015-02-19 DIAGNOSIS — H9193 Unspecified hearing loss, bilateral: Secondary | ICD-10-CM

## 2015-02-19 DIAGNOSIS — I10 Essential (primary) hypertension: Secondary | ICD-10-CM

## 2015-02-19 DIAGNOSIS — R609 Edema, unspecified: Secondary | ICD-10-CM

## 2015-02-19 DIAGNOSIS — Z0000 Encounter for general adult medical examination without abnormal findings: Secondary | ICD-10-CM | POA: Diagnosis not present

## 2015-02-19 DIAGNOSIS — Z23 Encounter for immunization: Secondary | ICD-10-CM | POA: Diagnosis not present

## 2015-02-19 MED ORDER — POTASSIUM CHLORIDE 40 MEQ/15ML (20%) PO SOLN: 40.0000 meq | Freq: Every day | ORAL | Status: DC

## 2015-02-19 MED ORDER — FUROSEMIDE 20 MG PO TABS: 20.0000 mg | ORAL_TABLET | Freq: Every day | ORAL | Status: DC

## 2015-02-20 LAB — PREALBUMIN: Prealbumin: 17 mg/dL (ref 17–34)

## 2015-02-22 ENCOUNTER — Telehealth: Payer: Self-pay | Admitting: *Deleted

## 2015-02-24 ENCOUNTER — Other Ambulatory Visit: Payer: Self-pay | Admitting: *Deleted

## 2015-02-24 MED ORDER — POTASSIUM CHLORIDE CRYS ER 20 MEQ PO TBCR: 40.0000 meq | EXTENDED_RELEASE_TABLET | Freq: Every day | ORAL | Status: DC

## 2015-02-24 NOTE — Telephone Encounter (Signed)
Patient son called states she can't afford potassium liquid changed to tablet form reviewed recent lab results with patient son and instructions to increase protein in patient diet.

## 2015-02-24 NOTE — Telephone Encounter (Signed)
Reviewed lab results with patient son

## 2015-03-13 ENCOUNTER — Encounter: Payer: Self-pay | Admitting: Internal Medicine

## 2015-03-13 NOTE — Progress Notes (Signed)
Subjective:    Patient ID: Brooke Boyd, female    DOB: 12/15/1917, 81 y.o.   MRN: [id number]  HPI She is now 81 years old and in today for health maintenance exam. Continues to reside alone with assistance from [place] and her family. Has mild dementia. Is able to keep her checkbook perfectly. Sometimes doesn't know day of the week. Repeats herself often. Accompanied by one of her sons. She has developed significant lower extremity edema. I suspect she sits a lot and has venous stasis. She is to take HCTZ for hypertension but became hyponatremic so that was discontinued.  Past medical history: 2014 had right rotator cuff arthropathy and received injection in right shoulder. Long-standing history of hypertension. History of left carotid stenosis of 40-60% in 2001 and had similar study in 2005 but has declined having further carotid Doppler study since 2008. Last mammogram was 2010 and has refused further mammography.  Zostavax vaccine August 2011. Pneumovax vaccine February 2011. Last tetanus immunization 1994.  Has had issues with right knee and had one steroid injection. Ambulates with a walker.  Social history: She is a widow. 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 Does not smoke. 2 sons.  Family history: Father died at age 60 of Parkinson's disease. Mother died at 52 of MI with history of hypertension. One brother died at age 75 of colon cancer. One brother died at age 23, gages of epilepsy. One sister with history of colon cancer. 2 adult sons in good health.  Review of Systems  HENT:       Hearing loss  Cardiovascular: Positive for leg swelling.  Psychiatric/Behavioral:       Mild dementia and forgetful       Objective:   Physical Exam  Constitutional: She appears well-developed and well-nourished. No distress.  Thin and frail but alert  HENT:  Head: Normocephalic and atraumatic.  Right Ear: External ear normal.  Left Ear: External ear  normal.  Mouth/Throat: Oropharynx is clear and moist. No oropharyngeal exudate.  Eyes: Conjunctivae and EOM are normal. Pupils are equal, round, and reactive to light. Right eye exhibits no discharge. Left eye exhibits no discharge. No scleral icterus.  Neck: Neck supple. No JVD present. No thyromegaly present.  Cardiovascular: Normal rate, regular rhythm and normal heart sounds.   No murmur heard. Pulmonary/Chest: Effort normal and breath sounds normal. No respiratory distress. She has no wheezes. She has no rales. She exhibits no tenderness.  Abdominal: Soft. Bowel sounds are normal. She exhibits no distension and no mass. There is no tenderness.  Genitourinary:  Deferred due to age  Musculoskeletal: She exhibits edema.  Significant ankle edema  Lymphadenopathy:    She has no cervical adenopathy.  Neurological: She is alert. She has normal reflexes. No cranial nerve deficit. Coordination normal.  Skin: Skin is warm and dry. No rash noted. She is not diaphoretic.  Psychiatric: She has a normal mood and affect. Her behavior is normal. Judgment and thought content normal.  Vitals reviewed.         Assessment & Plan:  Significant lower extremity edema suspect dependent edema. Check pre-albumin for protein stores. Start Lasix and potassium supplement. Discontinue HCTZ. Return in 10 days. Purchase compression hose at [facility].  Essential hypertension  Mild dementia  Bilateral hearing loss  Health maintenance-flu vaccine given Subjective:   Patient presents for Medicare Annual/Subsequent preventive examination.  Review Past Medical/Family/Social: See above   Risk Factors  Current exercise habits:  Dietary issues discussed:   Cardiac risk factors: Hypertension and family history  Depression Screen  (Note: if answer to either of the following is "Yes", a more complete depression screening is indicated)   Over the past two weeks, have you felt down, depressed  or hopeless? No  Over the past two weeks, have you felt little interest or pleasure in doing things? No Have you lost interest or pleasure in daily life? No Do you often feel hopeless? No Do you cry easily over simple problems? No   Activities of Daily Living  In your present state of health, do you have any difficulty performing the following activities?:   Driving? Does not drive Managing money? No  Feeding yourself? No  Getting from bed to chair? No  Climbing a flight of stairs? No  Preparing food and eating?: No  Bathing or showering? Take sponge bath Getting dressed: No  Getting to the toilet? No  Using the toilet:No  Moving around from place to place: No  In the past year have you fallen or had a near fall?:No  Are you sexually active? No  Do you have more than one partner? No   Hearing Difficulties: No  Do you often ask people to speak up or repeat themselves? No  Do you experience ringing or noises in your ears? No  Do you have difficulty understanding soft or whispered voices? No  Do you feel that you have a problem with memory? Sometimes Do you often misplace items? No    Home Safety:  Do you have a smoke alarm at your residence? Yes Do you have grab bars in the bathroom? Yes Do you have throw rugs in your house? Yes Cognitive Testing  Alert? Yes Normal Appearance?Yes  Oriented to person? Yes Place? Yes  Time? Yes  Recall of three objects? Yes  Can perform simple calculations? Yes  Displays appropriate judgment?Yes  Can read the correct time from a watch face?Yes   List the Names of Other Physician/Practitioners you currently use:  See referral list for the physicians patient is currently seeing.     Review of Systems:   Objective:     General appearance: Appears stated age Head: Normocephalic, without obvious abnormality, atraumatic  Eyes: conj clear, EOMi PEERLA  Ears: normal TM's and external ear canals both ears  Nose: Nares normal. Septum  midline. Mucosa normal. No drainage or sinus tenderness.  Throat: lips, mucosa, and tongue normal; teeth and gums normal  Neck: no adenopathy, no carotid bruit, no JVD, supple, symmetrical, trachea midline and thyroid not enlarged, symmetric, no tenderness/mass/nodules  No CVA tenderness.  Lungs: clear to auscultation bilaterally  Breasts: normal appearance, no masses or tenderness Heart: regular rate and rhythm, S1, S2 normal, no murmur, click, rub or gallop  Abdomen: soft, non-tender; bowel sounds normal; no masses, no organomegaly  Musculoskeletal: ROM normal in all joints, no crepitus, no deformity, Normal muscle strengthen. Back  is symmetric, no curvature. Skin: Skin color, texture, turgor normal. No rashes or lesions  Lymph nodes: Cervical, supraclavicular, and axillary nodes normal.  Neurologic: CN 2 -12 Normal, Normal symmetric reflexes. Normal coordination and gait  Psych: Alert & Oriented x 3, Mood appear stable.    Assessment:    Annual wellness medicare exam   Plan:    During the course of the visit the patient was educated and counseled about appropriate screening and preventive services including:   Declines mammogram. Did receive flu vaccine     Patient Instructions (the  written plan) was given to the patient.  Medicare Attestation  I have personally reviewed:  The patient's medical and social history  Their use of alcohol, tobacco or illicit drugs  Their current medications and supplements  The patient's functional ability including ADLs,fall risks, home safety risks, cognitive, and hearing and visual impairment  Diet and physical activities  Evidence for depression or mood disorders  The patient's weight, height, BMI, and visual acuity have been recorded in the chart. I have made referrals, counseling, and provided education to the patient based on review of the above and I have provided the patient with a written personalized care plan for preventive services.

## 2015-03-13 NOTE — Patient Instructions (Signed)
Start Lasix 20 mg daily and potassium supplement. Return in 10 days. Purchase compression hose at [facility] supply

## 2015-03-15 ENCOUNTER — Telehealth: Payer: Self-pay

## 2015-03-15 NOTE — Telephone Encounter (Signed)
Spoke with patients daughter in law and scheduled follow up appointment to discuss medication and labs.  Patient is not wearing stockings as directed.  Will discuss at appointment.

## 2015-03-23 ENCOUNTER — Other Ambulatory Visit: Payer: Medicare Other | Admitting: Internal Medicine

## 2015-03-23 DIAGNOSIS — E871 Hypo-osmolality and hyponatremia: Secondary | ICD-10-CM | POA: Diagnosis not present

## 2015-03-23 LAB — BASIC METABOLIC PANEL
BUN: 19 mg/dL (ref 7–25)
CALCIUM: 8.8 mg/dL (ref 8.6–10.4)
CO2: 27 mmol/L (ref 20–31)
CREATININE: 0.77 mg/dL (ref 0.60–0.88)
Chloride: 99 mmol/L (ref 98–110)
GLUCOSE: 79 mg/dL (ref 65–99)
Potassium: 4.1 mmol/L (ref 3.5–5.3)
Sodium: 136 mmol/L (ref 135–146)

## 2015-03-25 ENCOUNTER — Encounter: Payer: Self-pay | Admitting: Internal Medicine

## 2015-03-25 ENCOUNTER — Ambulatory Visit (INDEPENDENT_AMBULATORY_CARE_PROVIDER_SITE_OTHER): Payer: Medicare Other | Admitting: Internal Medicine

## 2015-03-25 VITALS — BP 100/58 | HR 100 | Temp 97.4°F | Resp 20 | Wt 123.0 lb

## 2015-03-25 DIAGNOSIS — I10 Essential (primary) hypertension: Secondary | ICD-10-CM | POA: Diagnosis not present

## 2015-03-25 DIAGNOSIS — R609 Edema, unspecified: Secondary | ICD-10-CM | POA: Diagnosis not present

## 2015-03-25 DIAGNOSIS — R413 Other amnesia: Secondary | ICD-10-CM

## 2015-03-25 NOTE — Progress Notes (Signed)
   Subjective:    Patient ID: Brooke Boyd, female    DOB: [birth year], 81 y.o.   MRN: [id number]  HPI  At last visit, she was started on Lasix for significant dependent edema of the lower extremities. There is been minimal improvement. She refuses to wear compression hose and will not keep her feet elevated. She had pre-albumin done October 3 which was low normal at 17. Son who accompanies her today says they're trying to improve her meals. He expressed frustration with her not wearing compression hose. She says the hose hurt. There may be some other device that is not quite so tight and cumbersome to wear. My guess is she's not been aware anything. Her potassium is stable at 4.1. I reviewed with son her medications today. He will needed some clarification on all the medications.    Review of Systems     Objective:   Physical Exam  Skin warm and dry. Chest clear to auscultation. Cardiac exam regular rate and rhythm. Significant lower extremity edema with minimal improvement despite Lasix 20 mg daily.      Assessment & Plan:  Dependent edema-continue Lasix 20 mg daily  Essential hypertension-blood pressure stable on current regimen  Hypokalemia-potassium improved 4.1 with potassium supplementation.  Memory loss-stable  Plan: Return in 3 months and continue same medications.

## 2015-03-25 NOTE — Patient Instructions (Signed)
Try to keep feet elevated as much as possible. Try to eat healthy meals. Return in 3 months for follow-up.

## 2015-03-26 ENCOUNTER — Telehealth: Payer: Self-pay | Admitting: Internal Medicine

## 2015-03-26 NOTE — Telephone Encounter (Signed)
Son states that you mentioned Naproxen yesterday for patient.  When he picked up her prescriptions yesterday, they didn't have a Rx for Naproxen for her.  He also doesn't understand what she would be taking the Naproxen for.  Can you please clarify?  And, does she in fact need the Naproxen at this time?    Pharmacy:  [facility] on [address]

## 2015-03-26 NOTE — Telephone Encounter (Signed)
Does not need unless she has arthritis pain.

## 2015-03-29 NOTE — Telephone Encounter (Signed)
LMOM for patient's son, Brooke Boyd regarding message from Dr. Renold Genta as stated below.  He is instructed to contact our office if they feel patient needs prescription for Naproxen.  Or, they can call patient's pharmacy and have them request the RX.

## 2015-04-21 ENCOUNTER — Other Ambulatory Visit: Payer: Self-pay

## 2015-04-21 NOTE — Telephone Encounter (Signed)
Please check chart. This has been discontinued

## 2015-04-22 ENCOUNTER — Other Ambulatory Visit: Payer: Self-pay

## 2015-04-22 ENCOUNTER — Telehealth: Payer: Self-pay | Admitting: Internal Medicine

## 2015-04-22 MED ORDER — POTASSIUM CHLORIDE CRYS ER 20 MEQ PO TBCR: 40.0000 meq | EXTENDED_RELEASE_TABLET | Freq: Every day | ORAL | Status: DC

## 2015-04-22 NOTE — Telephone Encounter (Signed)
Left message advising Brooke Boyd that this medication was discontinued

## 2015-04-22 NOTE — Telephone Encounter (Signed)
Son states patient needs her Potassium called in to her pharmacy please.

## 2015-04-22 NOTE — Telephone Encounter (Signed)
Please handle

## 2015-04-22 NOTE — Telephone Encounter (Signed)
This has been sent to pharmacy.

## 2015-04-27 DIAGNOSIS — H6121 Impacted cerumen, right ear: Secondary | ICD-10-CM | POA: Diagnosis not present

## 2015-04-27 DIAGNOSIS — H903 Sensorineural hearing loss, bilateral: Secondary | ICD-10-CM | POA: Diagnosis not present

## 2015-05-13 ENCOUNTER — Telehealth: Payer: Self-pay | Admitting: Internal Medicine

## 2015-05-13 NOTE — Telephone Encounter (Signed)
May book appt in a couple of weeks. Busy now with acute illness and mother's condition was stable at last visit. Have previously discussed that eventually she will need more care and will not be able to live alone. Daughter in law is a Marine scientist and cab assist in that decision.

## 2015-05-13 NOTE — Telephone Encounter (Signed)
Brooke Boyd, patient's son is calling and would like to make an appointment to come in and talk with Dr. Renold Genta about his mother's health.  States that when he last spoke with Dr. Renold Genta, he knows that she has CHF and he wants to know what that will mean for them going forward and what he can expect and what to look for.  States that her hearing is getting worse and he wants to know how to deal with her and give her the best quality of life going forward and just wants to make sure he knows what to look for, etc.  Advised that I would speak to you and find out what type of appointment we could set up for him to come in and speak with you.    He states this is not something that is a sense of urgency, but he would like to sit down and speak with you.  States that he is happy to pay for your time, but wants to have a better understanding of what lies ahead for their family.

## 2015-05-18 NOTE — Telephone Encounter (Signed)
Spoke with Brooke Boyd, patient's son today and advised that I am happy to set up an appointment in 3rd or 4th week of January for him to speak with Dr. Renold Genta.  Also advised about the daughter-in-law who is a Marine scientist and would be able to offer some good, sound advice to her family as well in this case regarding her mother-in-law's care and placement when needed regarding her health and safety.  Son states that he will look at his calendar and get back to me.

## 2015-06-11 ENCOUNTER — Other Ambulatory Visit: Payer: Self-pay | Admitting: Internal Medicine

## 2015-06-21 ENCOUNTER — Other Ambulatory Visit: Payer: Self-pay

## 2015-06-21 MED ORDER — POTASSIUM CHLORIDE CRYS ER 20 MEQ PO TBCR: 40.0000 meq | EXTENDED_RELEASE_TABLET | Freq: Every day | ORAL | Status: DC

## 2015-06-24 ENCOUNTER — Encounter: Payer: Self-pay | Admitting: Internal Medicine

## 2015-06-24 ENCOUNTER — Ambulatory Visit (INDEPENDENT_AMBULATORY_CARE_PROVIDER_SITE_OTHER): Payer: Medicare Other | Admitting: Internal Medicine

## 2015-06-24 VITALS — BP 114/58 | HR 90 | Temp 98.3°F | Resp 20 | Wt 121.0 lb

## 2015-06-24 DIAGNOSIS — R600 Localized edema: Secondary | ICD-10-CM | POA: Diagnosis not present

## 2015-06-24 DIAGNOSIS — I10 Essential (primary) hypertension: Secondary | ICD-10-CM | POA: Diagnosis not present

## 2015-06-24 DIAGNOSIS — R609 Edema, unspecified: Secondary | ICD-10-CM | POA: Diagnosis not present

## 2015-06-24 DIAGNOSIS — H9193 Unspecified hearing loss, bilateral: Secondary | ICD-10-CM

## 2015-06-24 LAB — BASIC METABOLIC PANEL
BUN: 18 mg/dL (ref 7–25)
CALCIUM: 8.7 mg/dL (ref 8.6–10.4)
CO2: 23 mmol/L (ref 20–31)
CREATININE: 0.73 mg/dL (ref 0.60–0.88)
Chloride: 102 mmol/L (ref 98–110)
Glucose, Bld: 101 mg/dL — ABNORMAL HIGH (ref 65–99)
Potassium: 4.2 mmol/L (ref 3.5–5.3)
Sodium: 136 mmol/L (ref 135–146)

## 2015-06-24 NOTE — Patient Instructions (Signed)
Continue low-dose Lasix  and potassium supplement. Return October 2017.

## 2015-06-24 NOTE — Progress Notes (Signed)
   Subjective:    Patient ID: Brooke Boyd, female    DOB: 05/06/1918, 82 y.o.   MRN: [id number]  HPI She is here today to follow-up on dependent edema and essential hypertension accompanied by her son. Dependent edema is about the same. I think spends a lot of time sitting in a chair with her feet down. We've not been able to make much progress with the edema despite diuretic therapy and she  refused  to wear compression stockings. She has received hearing age recently. Her hearing is much better. She continues to live alone in an apartment. She has some help from [employer]. Son is asking about DO NOT RESUSCITATE order. Explained to him that we did not keep those orders here. He will need to discuss with family attorney. Patient apparently has a living will. Son is asking whether or not she is in heart failure. In 2015 she had a normal BNP. I suspect she may have some mild heart failure but clearly is in no acute distress with normal pulse oximetry and normal vital signs.    Review of Systems     Objective:   Physical Exam  Skin warm and dry. Nodes none. Chest clear to auscultation. Cardiac exam regular rate and rhythm. She has pitting edema of the lower extremities above her ankles. However her skin is very fragile. Basic metabolic panel and BNP drawn.      Assessment & Plan:  Dependent edema-continue diuretic and potassium supplement  Essential hypertension-stable  End-of-life discussion  Bilateral hearing loss-now has hearing aids  Plan: Patient is to return in October for physical examination and fasting lab work.

## 2015-06-25 LAB — BRAIN NATRIURETIC PEPTIDE: BRAIN NATRIURETIC PEPTIDE: 25 pg/mL (ref <100)

## 2015-08-17 ENCOUNTER — Other Ambulatory Visit: Payer: Self-pay

## 2015-08-17 MED ORDER — POTASSIUM CHLORIDE CRYS ER 20 MEQ PO TBCR: 40.0000 meq | EXTENDED_RELEASE_TABLET | Freq: Every day | ORAL | Status: DC

## 2015-10-04 ENCOUNTER — Other Ambulatory Visit: Payer: Self-pay

## 2015-10-04 MED ORDER — FUROSEMIDE 20 MG PO TABS: 20.0000 mg | ORAL_TABLET | Freq: Every day | ORAL | Status: DC

## 2015-10-18 ENCOUNTER — Other Ambulatory Visit: Payer: Self-pay

## 2015-10-18 MED ORDER — POTASSIUM CHLORIDE CRYS ER 20 MEQ PO TBCR: 40.0000 meq | EXTENDED_RELEASE_TABLET | Freq: Every day | ORAL | Status: DC

## 2015-10-21 ENCOUNTER — Ambulatory Visit
Admission: RE | Admit: 2015-10-21 | Discharge: 2015-10-21 | Disposition: A | Discharge status: Home / Self Care | Payer: Medicare Other | Source: Ambulatory Visit | Attending: Internal Medicine | Admitting: Internal Medicine

## 2015-10-21 ENCOUNTER — Ambulatory Visit (INDEPENDENT_AMBULATORY_CARE_PROVIDER_SITE_OTHER): Payer: Medicare Other | Admitting: Internal Medicine

## 2015-10-21 ENCOUNTER — Encounter: Payer: Self-pay | Admitting: Internal Medicine

## 2015-10-21 VITALS — BP 152/70 | HR 60 | Temp 97.8°F | Resp 18 | Wt 110.0 lb

## 2015-10-21 DIAGNOSIS — R8299 Other abnormal findings in urine: Secondary | ICD-10-CM | POA: Diagnosis not present

## 2015-10-21 DIAGNOSIS — M549 Dorsalgia, unspecified: Secondary | ICD-10-CM

## 2015-10-21 DIAGNOSIS — M25552 Pain in left hip: Secondary | ICD-10-CM

## 2015-10-21 DIAGNOSIS — R8290 Unspecified abnormal findings in urine: Secondary | ICD-10-CM

## 2015-10-21 DIAGNOSIS — M5136 Other intervertebral disc degeneration, lumbar region: Secondary | ICD-10-CM | POA: Diagnosis not present

## 2015-10-21 LAB — POCT URINALYSIS DIPSTICK
BILIRUBIN UA: NEGATIVE
Glucose, UA: NEGATIVE
KETONES UA: NEGATIVE
NITRITE UA: NEGATIVE
PH UA: 8
PROTEIN UA: NEGATIVE
Urobilinogen, UA: 0.2

## 2015-10-21 NOTE — Progress Notes (Signed)
   Subjective:    Patient ID: Brooke Boyd, female    DOB: 1917/05/30, 82 y.o.   MRN: [id number]  HPI Accompanied by granddaughter, daughter-in-law and son today. Apparently she has started to complain of lower back and hip pain. Urinalysis is abnormal and will be cultured. No fever or chills. No known urinary symptoms. However his had considerable left hip pain over the weekend. Takes Aleve when necessary. Not used to taking strong pain medication. She lives alone and has some help from outside with [employer]. She ambulates with a walker. Complaining of left hip and low back pain today. Ambulates slowly with walker. No known falls.    Review of Systems     Objective:   Physical Exam  Pain with internal rotation of left hip. Cannot really externally rotate left hip due to pain and decreased range of motion. Palpable tenderness left lumbar spine area. Has erythematous area inside left buttock but no breakdown of skin      Assessment & Plan:  Left hip pain  Low back pain  Abnormal urinalysis-culture taken  Stage I pressure sore  Plan: Is to have x-ray of the left hip and LS-spine today. We need to determine where the pain is coming from ---could could be compression fracture or left hip osteoarthritis. Possibly could be an occult hip fracture. Patient is to take extra strength Tylenol 2 tabs twice daily with Aleve for now. Send home health nurse to evaluate. Patient family request home PT

## 2015-10-21 NOTE — Patient Instructions (Signed)
To have x-rays of LS-spine and left hip. Extra strength Tylenol 2 tablets twice daily along with Aleve. Further instructions to follow up. Home health ordered. Urine culture pending.

## 2015-10-22 ENCOUNTER — Telehealth: Payer: Self-pay

## 2015-10-22 ENCOUNTER — Other Ambulatory Visit: Payer: Self-pay | Admitting: Internal Medicine

## 2015-10-22 DIAGNOSIS — S32000A Wedge compression fracture of unspecified lumbar vertebra, initial encounter for closed fracture: Secondary | ICD-10-CM

## 2015-10-22 DIAGNOSIS — L89151 Pressure ulcer of sacral region, stage 1: Secondary | ICD-10-CM | POA: Diagnosis not present

## 2015-10-22 DIAGNOSIS — S22000A Wedge compression fracture of unspecified thoracic vertebra, initial encounter for closed fracture: Secondary | ICD-10-CM

## 2015-10-22 DIAGNOSIS — M25552 Pain in left hip: Secondary | ICD-10-CM | POA: Diagnosis not present

## 2015-10-22 DIAGNOSIS — M549 Dorsalgia, unspecified: Secondary | ICD-10-CM | POA: Diagnosis not present

## 2015-10-22 DIAGNOSIS — R8290 Unspecified abnormal findings in urine: Secondary | ICD-10-CM | POA: Diagnosis not present

## 2015-10-22 DIAGNOSIS — Z7982 Long term (current) use of aspirin: Secondary | ICD-10-CM | POA: Diagnosis not present

## 2015-10-22 NOTE — Telephone Encounter (Signed)
Brooke Boyd) called for verbal orders on home health nurse visits- 2x wk for 2 weeks and 1x week for 2 weeks. Verbal order was given as we sent the order over yesterday for [facility] eval and treat

## 2015-10-23 LAB — CULTURE, URINE COMPREHENSIVE

## 2015-10-24 ENCOUNTER — Telehealth: Payer: Self-pay | Admitting: Internal Medicine

## 2015-10-24 MED ORDER — NITROFURANTOIN MONOHYD MACRO 100 MG PO CAPS: ORAL_CAPSULE | ORAL | Status: DC

## 2015-10-24 NOTE — Telephone Encounter (Signed)
E- scribed  Macrobid 100 mg twice daily for E coli UTI

## 2015-10-25 DIAGNOSIS — L89151 Pressure ulcer of sacral region, stage 1: Secondary | ICD-10-CM | POA: Diagnosis not present

## 2015-10-25 DIAGNOSIS — R8290 Unspecified abnormal findings in urine: Secondary | ICD-10-CM | POA: Diagnosis not present

## 2015-10-25 DIAGNOSIS — M549 Dorsalgia, unspecified: Secondary | ICD-10-CM | POA: Diagnosis not present

## 2015-10-25 DIAGNOSIS — Z7982 Long term (current) use of aspirin: Secondary | ICD-10-CM | POA: Diagnosis not present

## 2015-10-25 DIAGNOSIS — M25552 Pain in left hip: Secondary | ICD-10-CM | POA: Diagnosis not present

## 2015-10-26 DIAGNOSIS — L89151 Pressure ulcer of sacral region, stage 1: Secondary | ICD-10-CM | POA: Diagnosis not present

## 2015-10-26 DIAGNOSIS — Z7982 Long term (current) use of aspirin: Secondary | ICD-10-CM | POA: Diagnosis not present

## 2015-10-26 DIAGNOSIS — M549 Dorsalgia, unspecified: Secondary | ICD-10-CM | POA: Diagnosis not present

## 2015-10-26 DIAGNOSIS — R8290 Unspecified abnormal findings in urine: Secondary | ICD-10-CM | POA: Diagnosis not present

## 2015-10-26 DIAGNOSIS — M25552 Pain in left hip: Secondary | ICD-10-CM | POA: Diagnosis not present

## 2015-10-27 DIAGNOSIS — M25552 Pain in left hip: Secondary | ICD-10-CM | POA: Diagnosis not present

## 2015-10-27 DIAGNOSIS — L89151 Pressure ulcer of sacral region, stage 1: Secondary | ICD-10-CM | POA: Diagnosis not present

## 2015-10-27 DIAGNOSIS — M549 Dorsalgia, unspecified: Secondary | ICD-10-CM | POA: Diagnosis not present

## 2015-10-27 DIAGNOSIS — Z7982 Long term (current) use of aspirin: Secondary | ICD-10-CM | POA: Diagnosis not present

## 2015-10-27 DIAGNOSIS — R8290 Unspecified abnormal findings in urine: Secondary | ICD-10-CM | POA: Diagnosis not present

## 2015-10-28 DIAGNOSIS — M549 Dorsalgia, unspecified: Secondary | ICD-10-CM | POA: Diagnosis not present

## 2015-10-28 DIAGNOSIS — Z7982 Long term (current) use of aspirin: Secondary | ICD-10-CM | POA: Diagnosis not present

## 2015-10-28 DIAGNOSIS — M25552 Pain in left hip: Secondary | ICD-10-CM | POA: Diagnosis not present

## 2015-10-28 DIAGNOSIS — L89151 Pressure ulcer of sacral region, stage 1: Secondary | ICD-10-CM | POA: Diagnosis not present

## 2015-10-28 DIAGNOSIS — R8290 Unspecified abnormal findings in urine: Secondary | ICD-10-CM | POA: Diagnosis not present

## 2015-11-02 DIAGNOSIS — L89151 Pressure ulcer of sacral region, stage 1: Secondary | ICD-10-CM | POA: Diagnosis not present

## 2015-11-02 DIAGNOSIS — M549 Dorsalgia, unspecified: Secondary | ICD-10-CM | POA: Diagnosis not present

## 2015-11-02 DIAGNOSIS — M25552 Pain in left hip: Secondary | ICD-10-CM | POA: Diagnosis not present

## 2015-11-02 DIAGNOSIS — Z7982 Long term (current) use of aspirin: Secondary | ICD-10-CM | POA: Diagnosis not present

## 2015-11-02 DIAGNOSIS — R8290 Unspecified abnormal findings in urine: Secondary | ICD-10-CM | POA: Diagnosis not present

## 2015-11-03 ENCOUNTER — Ambulatory Visit
Admission: RE | Admit: 2015-11-03 | Discharge: 2015-11-03 | Disposition: A | Discharge status: Home / Self Care | Payer: Medicare Other | Source: Ambulatory Visit | Attending: Internal Medicine | Admitting: Internal Medicine

## 2015-11-03 DIAGNOSIS — S32000A Wedge compression fracture of unspecified lumbar vertebra, initial encounter for closed fracture: Secondary | ICD-10-CM | POA: Diagnosis not present

## 2015-11-03 DIAGNOSIS — S22000A Wedge compression fracture of unspecified thoracic vertebra, initial encounter for closed fracture: Secondary | ICD-10-CM

## 2015-11-03 NOTE — Consult Note (Signed)
Chief Complaint: Patient was seen in consultation today for  Chief Complaint  Patient presents with  . Advice Only    Consult for T12 compression Fx   at the request of Elby Showers  Referring Physician(s): Elby Showers  Supervising Physician: Marybelle Killings  Patient Status: Outpatient  History of Present Illness: Brooke Boyd is a 82 y.o. female with a four-week history of increasing left hip pain. The pain is primarily anterior, within her left groin. She does describe some pain laterally over the left hip region. She denies a fall and is normally ambulatory. She has some pain across her low back that is nonfocal. She does not describe any catching type pain. This interferes with her daily activities but does not hinder her sleeping. It is somewhat uncomfortable for her to sit. After discussion with her family, approximately 7 years ago, she went from very ambulatory to sedentary and may have suffered an injury then. She is referred for possible L1 or T12 kyphoplasty. She denies any numbness or weakness. She does walk with a limp due to a leg length discrepancy.  Past Medical History  Diagnosis Date  . Hypertension   . Left carotid stenosis     Past Surgical History  Procedure Laterality Date  . Abdominal hysterectomy    . Eye surgery  12/2009    cataract    Allergies: Review of patient's allergies indicates no known allergies.  Medications: Prior to Admission medications   Medication Sig Start Date End Date Taking? Authorizing Provider  acetaminophen (TYLENOL) 500 MG tablet Take 1,000 mg by mouth daily.   Yes Historical Provider, MD  aspirin 81 MG tablet Take 81 mg by mouth daily.     Yes Historical Provider, MD  cholecalciferol (D-VI-SOL) 400 UNIT/ML LIQD Take 400 Units by mouth daily.   Yes Historical Provider, MD  furosemide (LASIX) 20 MG tablet Take 1 tablet (20 mg total) by mouth daily. 10/04/15  Yes Elby Showers, MD  naproxen sodium (ANAPROX) 220 MG  tablet Take 220 mg by mouth 2 (two) times daily with a meal.   Yes Historical Provider, MD  nitrofurantoin, macrocrystal-monohydrate, (MACROBID) 100 MG capsule One capsule twice daily for urinary infection 10/24/15  Yes Elby Showers, MD  potassium chloride SA (K-DUR,KLOR-CON) 20 MEQ tablet Take 2 tablets (40 mEq total) by mouth daily. 10/18/15  Yes Elby Showers, MD     Family History  Problem Relation Age of Onset  . Heart disease Mother     Social History   Social History  . Marital Status: Widowed    Spouse Name: N/A  . Number of Children: N/A  . Years of Education: N/A   Social History Main Topics  . Smoking status: Never Smoker   . Smokeless tobacco: Not on file  . Alcohol Use: Not on file  . Drug Use: Not on file  . Sexual Activity: Not on file   Other Topics Concern  . Not on file   Social History Narrative     Review of Systems: A 12 point ROS discussed and pertinent positives are indicated in the HPI above.  All other systems are negative.  Review of Systems  Vital Signs: BP 145/59 mmHg  Pulse 89  Temp(Src) 97.8 F (36.6 C) (Oral)  Resp 14  Wt 110 lb (49.896 kg)  SpO2 95%  Physical Exam  Constitutional: She is oriented to person, place, and time. She appears well-developed and well-nourished.  HENT:  Head: Normocephalic  and atraumatic.  Musculoskeletal:  Lower extremities are neurologically intact. Motor and sensation are intact. The left leg is longer than the right leg.  There is some tenderness over the left greater trochanter.  There is no tenderness over the midline of the low back whatsoever.  Neurological: She is alert and oriented to person, place, and time.    Mallampati Score:     Imaging: Dg Lumbar Spine 2-3 Views  10/21/2015  CLINICAL DATA:  Five days of left hip pain without definite history of a fall. EXAM: LUMBAR SPINE - 2-3 VIEW COMPARISON:  No previous studies for review FINDINGS: The bones are osteopenic. There is wedge  compression of T12 and L1 with loss of height anteriorly of approximately 30%. No definite retropulsion of bone is observed. There is moderate to severe multilevel degenerative disc space narrowing throughout the lumbar spine. There are anterior and posterior endplate osteophytes. Where visualized the pedicles are intact. Limited visualization of the sacrum is available due to severe osteopenia. IMPRESSION: 1. 30% anterior wedge compressions of the bodies of T12 and L1 that are of uncertain age. 2. Moderate to severe multilevel degenerative disc disease with disc space height loss and anterior posterior endplate osteophytes. 3. Severe osteopenia. Electronically Signed   By: David  Martinique M.D.   On: 10/21/2015 11:32   Dg Hip Unilat W Or W/o Pelvis 2-3 Views Left  10/21/2015  CLINICAL DATA:  Left hip pain for the past 5 days without definite history of a fall EXAM: DG HIP (WITH OR WITHOUT PELVIS) 2-3V LEFT COMPARISON:  None in PACs FINDINGS: The bones are diffusely osteopenic. There is severe deformity of the right hip with flattening of the femoral head and deformity of the acetabulum consistent with chronic dislocation. The left hip joint space is reasonably well maintained on the AP view in exhibits mild symmetric narrowing on the frog-leg lateral view. The articular surfaces remains smoothly rounded. The femoral neck, intertrochanteric, and subtrochanteric regions appear normal. The observed portions of the left hemipelvis exhibit no acute abnormalities. There is degenerative disc disease of the lumbar spine. IMPRESSION: 1. No acute fracture nor dislocation of the left hip is observed. Mild osteoarthritic joint space narrowing is present. 2. Severe chronic deformity of the right femoral head and acetabulum with no normal appearing hip joint present. The findings suggest the sequelae of chronic right hip dislocation. 3. Degenerative disc space narrowing of the visualized portions of the lumbar spine.  Electronically Signed   By: David  Martinique M.D.   On: 10/21/2015 11:30   The above imaging demonstrates T12 and L1 compression fractures by plain radiography that have a chronic appearance. MRI has not yet been performed. Plain radiographs of her pelvis demonstrate a chronically dislocated right hip joint and advanced degenerative changes. There is some degenerative change of the left hip joint.   Labs:  CBC:  Recent Labs  02/15/15 0905  WBC 6.7  HGB 12.0  HCT 34.5*  PLT 320    COAGS: No results for input(s): INR, APTT in the last 8760 hours.  BMP:  Recent Labs  02/15/15 0905 03/23/15 0943 06/24/15 1201  NA 136 136 136  K 3.4* 4.1 4.2  CL 97* 99 102  CO2 29 27 23   GLUCOSE 92 79 101*  BUN 15 19 18   CALCIUM 8.9 8.8 8.7  CREATININE 0.67 0.77 0.73  GFRNONAA 75  --   --   GFRAA 86  --   --     LIVER FUNCTION TESTS:  Recent Labs  02/15/15 0905  BILITOT 0.5  AST 17  ALT 9  ALKPHOS 75  PROT 7.0  ALBUMIN 3.7    TUMOR MARKERS: No results for input(s): AFPTM, CEA, CA199, CHROMGRNA in the last 8760 hours.  Assessment and Plan:  Regarding the T12 and L1 compression deformities, these areas are completely asymptomatic. Further workup with MRI is not necessary as treatment would be conservative. Kyphoplasty or vertebroplasty are not indicated at this time. She does have pain over the left hip which is most likely related to left hip arthritis or greater trochanter bursitis. I doubt occult fracture of the left femoral neck. More than likely, because of the leg length discrepancy and deformity of the right hip, she places more weight on the left hip resulting in symptoms. Orthopedic consultation is recommended.  Thank you for this interesting consult.  I greatly enjoyed meeting Brooke Boyd and look forward to participating in their care.  A copy of this report was sent to the requesting provider on this date.  Electronically Signed: Braelin Costlow, ART A 11/03/2015, 9:45  AM   I spent a total of  30 Minutes   in face to face in clinical consultation, greater than 50% of which was counseling/coordinating care for kyphoplasty.

## 2015-11-04 DIAGNOSIS — R8290 Unspecified abnormal findings in urine: Secondary | ICD-10-CM | POA: Diagnosis not present

## 2015-11-04 DIAGNOSIS — Z7982 Long term (current) use of aspirin: Secondary | ICD-10-CM | POA: Diagnosis not present

## 2015-11-04 DIAGNOSIS — L89151 Pressure ulcer of sacral region, stage 1: Secondary | ICD-10-CM | POA: Diagnosis not present

## 2015-11-04 DIAGNOSIS — M25552 Pain in left hip: Secondary | ICD-10-CM | POA: Diagnosis not present

## 2015-11-04 DIAGNOSIS — M549 Dorsalgia, unspecified: Secondary | ICD-10-CM | POA: Diagnosis not present

## 2015-11-05 DIAGNOSIS — M25552 Pain in left hip: Secondary | ICD-10-CM | POA: Diagnosis not present

## 2015-11-05 DIAGNOSIS — Z7982 Long term (current) use of aspirin: Secondary | ICD-10-CM | POA: Diagnosis not present

## 2015-11-05 DIAGNOSIS — L89151 Pressure ulcer of sacral region, stage 1: Secondary | ICD-10-CM | POA: Diagnosis not present

## 2015-11-05 DIAGNOSIS — R8290 Unspecified abnormal findings in urine: Secondary | ICD-10-CM | POA: Diagnosis not present

## 2015-11-05 DIAGNOSIS — M549 Dorsalgia, unspecified: Secondary | ICD-10-CM | POA: Diagnosis not present

## 2015-11-09 DIAGNOSIS — M549 Dorsalgia, unspecified: Secondary | ICD-10-CM | POA: Diagnosis not present

## 2015-11-09 DIAGNOSIS — M533 Sacrococcygeal disorders, not elsewhere classified: Secondary | ICD-10-CM | POA: Diagnosis not present

## 2015-11-09 DIAGNOSIS — M25552 Pain in left hip: Secondary | ICD-10-CM | POA: Diagnosis not present

## 2015-11-09 DIAGNOSIS — L89151 Pressure ulcer of sacral region, stage 1: Secondary | ICD-10-CM | POA: Diagnosis not present

## 2015-11-09 DIAGNOSIS — R8290 Unspecified abnormal findings in urine: Secondary | ICD-10-CM | POA: Diagnosis not present

## 2015-11-09 DIAGNOSIS — Z7982 Long term (current) use of aspirin: Secondary | ICD-10-CM | POA: Diagnosis not present

## 2015-11-10 ENCOUNTER — Other Ambulatory Visit (HOSPITAL_COMMUNITY): Payer: Self-pay | Admitting: Orthopedic Surgery

## 2015-11-10 DIAGNOSIS — Z7982 Long term (current) use of aspirin: Secondary | ICD-10-CM | POA: Diagnosis not present

## 2015-11-10 DIAGNOSIS — M549 Dorsalgia, unspecified: Secondary | ICD-10-CM | POA: Diagnosis not present

## 2015-11-10 DIAGNOSIS — R8290 Unspecified abnormal findings in urine: Secondary | ICD-10-CM | POA: Diagnosis not present

## 2015-11-10 DIAGNOSIS — L89151 Pressure ulcer of sacral region, stage 1: Secondary | ICD-10-CM | POA: Diagnosis not present

## 2015-11-10 DIAGNOSIS — M25552 Pain in left hip: Secondary | ICD-10-CM | POA: Diagnosis not present

## 2015-11-10 DIAGNOSIS — M533 Sacrococcygeal disorders, not elsewhere classified: Secondary | ICD-10-CM

## 2015-11-12 DIAGNOSIS — L89151 Pressure ulcer of sacral region, stage 1: Secondary | ICD-10-CM | POA: Diagnosis not present

## 2015-11-12 DIAGNOSIS — M25552 Pain in left hip: Secondary | ICD-10-CM | POA: Diagnosis not present

## 2015-11-12 DIAGNOSIS — R8290 Unspecified abnormal findings in urine: Secondary | ICD-10-CM | POA: Diagnosis not present

## 2015-11-12 DIAGNOSIS — Z7982 Long term (current) use of aspirin: Secondary | ICD-10-CM | POA: Diagnosis not present

## 2015-11-12 DIAGNOSIS — M549 Dorsalgia, unspecified: Secondary | ICD-10-CM | POA: Diagnosis not present

## 2015-11-15 DIAGNOSIS — Z7982 Long term (current) use of aspirin: Secondary | ICD-10-CM | POA: Diagnosis not present

## 2015-11-15 DIAGNOSIS — R8290 Unspecified abnormal findings in urine: Secondary | ICD-10-CM | POA: Diagnosis not present

## 2015-11-15 DIAGNOSIS — L89151 Pressure ulcer of sacral region, stage 1: Secondary | ICD-10-CM | POA: Diagnosis not present

## 2015-11-15 DIAGNOSIS — M549 Dorsalgia, unspecified: Secondary | ICD-10-CM | POA: Diagnosis not present

## 2015-11-15 DIAGNOSIS — M25552 Pain in left hip: Secondary | ICD-10-CM | POA: Diagnosis not present

## 2015-11-18 ENCOUNTER — Encounter (HOSPITAL_COMMUNITY): Payer: Medicare Other

## 2015-11-18 ENCOUNTER — Ambulatory Visit (HOSPITAL_COMMUNITY): Payer: Medicare Other

## 2015-11-18 DIAGNOSIS — M25552 Pain in left hip: Secondary | ICD-10-CM | POA: Diagnosis not present

## 2015-11-18 DIAGNOSIS — Z7982 Long term (current) use of aspirin: Secondary | ICD-10-CM | POA: Diagnosis not present

## 2015-11-18 DIAGNOSIS — M549 Dorsalgia, unspecified: Secondary | ICD-10-CM | POA: Diagnosis not present

## 2015-11-18 DIAGNOSIS — R8290 Unspecified abnormal findings in urine: Secondary | ICD-10-CM | POA: Diagnosis not present

## 2015-11-18 DIAGNOSIS — L89151 Pressure ulcer of sacral region, stage 1: Secondary | ICD-10-CM | POA: Diagnosis not present

## 2015-11-22 DIAGNOSIS — R8290 Unspecified abnormal findings in urine: Secondary | ICD-10-CM | POA: Diagnosis not present

## 2015-11-22 DIAGNOSIS — M25552 Pain in left hip: Secondary | ICD-10-CM | POA: Diagnosis not present

## 2015-11-22 DIAGNOSIS — M549 Dorsalgia, unspecified: Secondary | ICD-10-CM | POA: Diagnosis not present

## 2015-11-22 DIAGNOSIS — Z7982 Long term (current) use of aspirin: Secondary | ICD-10-CM | POA: Diagnosis not present

## 2015-11-22 DIAGNOSIS — L89151 Pressure ulcer of sacral region, stage 1: Secondary | ICD-10-CM | POA: Diagnosis not present

## 2015-11-29 ENCOUNTER — Encounter (HOSPITAL_COMMUNITY): Payer: Medicare Other

## 2015-11-29 ENCOUNTER — Other Ambulatory Visit (INDEPENDENT_AMBULATORY_CARE_PROVIDER_SITE_OTHER): Payer: Medicare Other | Admitting: Internal Medicine

## 2015-11-29 ENCOUNTER — Ambulatory Visit (HOSPITAL_COMMUNITY)
Admission: RE | Admit: 2015-11-29 | Discharge: 2015-11-29 | Disposition: A | Discharge status: Home / Self Care | Payer: Medicare Other | Source: Ambulatory Visit | Attending: Orthopedic Surgery | Admitting: Orthopedic Surgery

## 2015-11-29 DIAGNOSIS — R938 Abnormal findings on diagnostic imaging of other specified body structures: Secondary | ICD-10-CM | POA: Diagnosis not present

## 2015-11-29 DIAGNOSIS — M533 Sacrococcygeal disorders, not elsewhere classified: Secondary | ICD-10-CM | POA: Diagnosis not present

## 2015-11-29 DIAGNOSIS — R300 Dysuria: Secondary | ICD-10-CM

## 2015-11-29 DIAGNOSIS — M13851 Other specified arthritis, right hip: Secondary | ICD-10-CM | POA: Insufficient documentation

## 2015-11-29 DIAGNOSIS — M545 Low back pain: Secondary | ICD-10-CM | POA: Diagnosis not present

## 2015-11-29 LAB — POCT URINALYSIS DIPSTICK
BILIRUBIN UA: NEGATIVE
Glucose, UA: NEGATIVE
Ketones, UA: 40
Nitrite, UA: POSITIVE
Protein, UA: NEGATIVE
Urobilinogen, UA: 0.2
pH, UA: 7.5

## 2015-11-29 MED ORDER — TECHNETIUM TC 99M MEDRONATE IV KIT
25.7000 | PACK | Freq: Once | INTRAVENOUS | Status: AC | PRN
  Administered 2015-11-29: 25.7 via INTRAVENOUS

## 2015-12-01 LAB — CULTURE, URINE COMPREHENSIVE

## 2015-12-02 ENCOUNTER — Telehealth: Payer: Self-pay

## 2015-12-02 MED ORDER — CIPROFLOXACIN HCL 250 MG PO TABS: 250.0000 mg | ORAL_TABLET | Freq: Two times a day (BID) | ORAL | Status: DC

## 2015-12-02 NOTE — Telephone Encounter (Signed)
-----   Message from Elby Showers, MD sent at 12/02/2015 10:11 AM EDT ----- Please call family and let them know she has UTI. Prescribe Cipro 250 mg twice daily x 7 days

## 2015-12-02 NOTE — Telephone Encounter (Signed)
Spoke to son Dominica Severin. Gave lab results. Son verbalized understanding. Advised Rx'd med to pharmacy on file. [facility] on [address].

## 2015-12-07 ENCOUNTER — Telehealth: Payer: Self-pay | Admitting: Internal Medicine

## 2015-12-07 NOTE — Telephone Encounter (Signed)
They can bring [place] a follow-up specimen for testing and reculture if necessary. If urinary tract infection persists, she will need to see urologist

## 2015-12-07 NOTE — Telephone Encounter (Signed)
Brooke Boyd, the son is calling; states that patient will complete antibiotic tomorrow.   Wants to know when we can re-test the patient and find out if she is rid of this infection?  I asked if she is still symptomatic?  He states that patient is complaining of still having some back pain today, right sided pain.  States that he knows that sometimes 7 days is not enough of the medication, and sometimes 10 or even 14 days is not enough.  States that he has even known of 90 days having had to be used to rid of this sort of thing in aged patients.  He states he just wants to take care of this issue and get ird of the infection.  Not sure that he is taking into consideration that this is an ongoing issue in aged patients due to perhaps incontinence, poor hygiene, etc.    He would like for [place] to get back to him with an answer ASAP.    [phone]

## 2015-12-08 NOTE — Telephone Encounter (Signed)
Spoke to son's wife Katharine Look. She verbalized understanding of Dr. Verlene Mayer instructions.  She stated she will come by the office tomorrow to pick up specimen cup.

## 2015-12-24 DIAGNOSIS — M5136 Other intervertebral disc degeneration, lumbar region: Secondary | ICD-10-CM | POA: Diagnosis not present

## 2015-12-24 DIAGNOSIS — M5137 Other intervertebral disc degeneration, lumbosacral region: Secondary | ICD-10-CM | POA: Diagnosis not present

## 2016-01-17 ENCOUNTER — Encounter: Payer: Self-pay | Admitting: Internal Medicine

## 2016-01-17 ENCOUNTER — Telehealth: Payer: Self-pay | Admitting: Internal Medicine

## 2016-01-17 MED ORDER — FUROSEMIDE 20 MG PO TABS: 20.0000 mg | ORAL_TABLET | Freq: Every day | ORAL | 5 refills | Status: DC

## 2016-01-17 NOTE — Telephone Encounter (Signed)
Refill lasix x 6 months for dependent edema

## 2016-02-28 ENCOUNTER — Encounter: Payer: Medicare Other | Admitting: Internal Medicine

## 2016-03-13 ENCOUNTER — Encounter: Payer: Self-pay | Admitting: Internal Medicine

## 2016-03-13 ENCOUNTER — Ambulatory Visit (INDEPENDENT_AMBULATORY_CARE_PROVIDER_SITE_OTHER): Payer: Medicare Other | Admitting: Internal Medicine

## 2016-03-13 ENCOUNTER — Other Ambulatory Visit: Payer: Self-pay | Admitting: Internal Medicine

## 2016-03-13 VITALS — BP 150/84 | HR 91 | Temp 97.8°F | Ht <= 58 in | Wt 100.0 lb

## 2016-03-13 DIAGNOSIS — F0390 Unspecified dementia without behavioral disturbance: Secondary | ICD-10-CM

## 2016-03-13 DIAGNOSIS — R609 Edema, unspecified: Secondary | ICD-10-CM

## 2016-03-13 DIAGNOSIS — I10 Essential (primary) hypertension: Secondary | ICD-10-CM | POA: Diagnosis not present

## 2016-03-13 DIAGNOSIS — D649 Anemia, unspecified: Secondary | ICD-10-CM | POA: Diagnosis not present

## 2016-03-13 DIAGNOSIS — Z23 Encounter for immunization: Secondary | ICD-10-CM | POA: Diagnosis not present

## 2016-03-13 DIAGNOSIS — E559 Vitamin D deficiency, unspecified: Secondary | ICD-10-CM

## 2016-03-13 DIAGNOSIS — Z0000 Encounter for general adult medical examination without abnormal findings: Secondary | ICD-10-CM

## 2016-03-13 DIAGNOSIS — R413 Other amnesia: Secondary | ICD-10-CM | POA: Diagnosis not present

## 2016-03-13 DIAGNOSIS — H9193 Unspecified hearing loss, bilateral: Secondary | ICD-10-CM

## 2016-03-13 DIAGNOSIS — R8290 Unspecified abnormal findings in urine: Secondary | ICD-10-CM | POA: Diagnosis not present

## 2016-03-13 DIAGNOSIS — R300 Dysuria: Secondary | ICD-10-CM

## 2016-03-13 DIAGNOSIS — F03A0 Unspecified dementia, mild, without behavioral disturbance, psychotic disturbance, mood disturbance, and anxiety: Secondary | ICD-10-CM

## 2016-03-13 LAB — POCT URINALYSIS DIPSTICK
BILIRUBIN UA: NEGATIVE
GLUCOSE UA: NEGATIVE
Ketones, UA: NEGATIVE
NITRITE UA: NEGATIVE
RBC UA: NEGATIVE
Spec Grav, UA: <=1.005
UROBILINOGEN UA: 0.2
pH, UA: 8

## 2016-03-13 LAB — CBC WITH DIFFERENTIAL/PLATELET
BASOS PCT: 0 %
Basophils Absolute: 0 cells/uL (ref 0–200)
EOS PCT: 2 %
Eosinophils Absolute: 142 cells/uL (ref 15–500)
HCT: 29.1 % — ABNORMAL LOW (ref 35.0–45.0)
Hemoglobin: 9.6 g/dL — ABNORMAL LOW (ref 11.7–15.5)
LYMPHS PCT: 39 %
Lymphs Abs: 2769 cells/uL (ref 850–3900)
MCH: 30.7 pg (ref 27.0–33.0)
MCHC: 33 g/dL (ref 32.0–36.0)
MCV: 93 fL (ref 80.0–100.0)
MONOS PCT: 7 %
MPV: 9.2 fL (ref 7.5–12.5)
Monocytes Absolute: 497 cells/uL (ref 200–950)
NEUTROS ABS: 3692 {cells}/uL (ref 1500–7800)
Neutrophils Relative %: 52 %
PLATELETS: 242 10*3/uL (ref 140–400)
RBC: 3.13 MIL/uL — ABNORMAL LOW (ref 3.80–5.10)
RDW: 13.5 % (ref 11.0–15.0)
WBC: 7.1 10*3/uL (ref 3.8–10.8)

## 2016-03-13 LAB — COMPREHENSIVE METABOLIC PANEL
ALK PHOS: 87 U/L (ref 33–130)
ALT: 8 U/L (ref 6–29)
AST: 15 U/L (ref 10–35)
Albumin: 3.5 g/dL — ABNORMAL LOW (ref 3.6–5.1)
BUN: 28 mg/dL — AB (ref 7–25)
CALCIUM: 8.7 mg/dL (ref 8.6–10.4)
CO2: 25 mmol/L (ref 20–31)
Chloride: 102 mmol/L (ref 98–110)
Creat: 0.99 mg/dL — ABNORMAL HIGH (ref 0.60–0.88)
GLUCOSE: 90 mg/dL (ref 65–99)
POTASSIUM: 4.2 mmol/L (ref 3.5–5.3)
Sodium: 139 mmol/L (ref 135–146)
Total Bilirubin: 0.5 mg/dL (ref 0.2–1.2)
Total Protein: 6.6 g/dL (ref 6.1–8.1)

## 2016-03-13 LAB — LIPID PANEL
CHOL/HDL RATIO: 3.2 ratio (ref <=5.0)
Cholesterol: 147 mg/dL (ref 125–200)
HDL: 46 mg/dL (ref >=46)
LDL CALC: 86 mg/dL (ref <130)
Triglycerides: 73 mg/dL (ref <150)
VLDL: 15 mg/dL (ref <30)

## 2016-03-13 LAB — TSH: TSH: 3.83 m[IU]/L

## 2016-03-13 NOTE — Progress Notes (Signed)
Subjective:    Patient ID: Brooke Boyd, female    DOB: 03/09/1918, 82 y.o.   MRN: [id number]  HPI 82 year old Female in today for health maintenance exam and evaluation of medical issues. She is accompanied by her son. She has hearing loss and wears a right hearing aid. She uses a walker to ambulate. Recently had a fall at home. Apparently was on the floor for a couple of hours. Could not figure out how to activate her called for help device. EMS came and evaluated her and she declined to go to the hospital. She continues to reside alone. They have engaged to lady to prepare couple of meals a day for her.  Discussion with son and patient about living to assisted living. They don't feel that this is necessary at the present time.  Her dependent edema has improved.  In the Summer of 2017 she had pain in her hip and back. Bone scan showed increased uptake within the lower thoracic and upper lumbar spine compatible with compression deformities. No abnormal increased uptake in sacrum or iliac joints. Advanced right hip arthritis. Was sent to interventional radiology regarding thoracic compression fracture and lumbar compression fracture. Received treatment and has no further complaint of back pain.  Son suspects she has had more falls than we are aware of  Son says she eats candy and drinks [facility].  Diagnosed with right shoulder rotator cuff tear arthropathy 2013 and treated with injection in [facility] orthopedics  She has a lesion on her right external ear. Son says it has not been present very long. Recommended antibiotic ointment for this and if no improvement is to see dermatologist.  Patient given flu vaccine today.  Social history: She is a widow. 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 Does not smoke. 2 sons.  Family history: Father died at age 40 of Parkinson's disease. Mother died at age 52 of MI with history of hypertension. One brother died at  age 80 of colon cancer. One brother died at age 38 with complications of of epilepsy. One sister with history of colon cancer. 2 adult sons in good health.    Review of Systems  Constitutional: Positive for fatigue.  Respiratory: Negative.   Cardiovascular: Negative.   Gastrointestinal: Negative.        Objective:   Physical Exam  Constitutional: She appears well-developed and well-nourished. No distress.  HENT:  Head: Normocephalic and atraumatic.  Right Ear: External ear normal.  Left Ear: External ear normal.  Mouth/Throat: Oropharynx is clear and moist.  Eyes: Right eye exhibits no discharge. Left eye exhibits no discharge.  Neck: Neck supple. No thyromegaly present.  Cardiovascular: Normal rate, regular rhythm and normal heart sounds.   Pulmonary/Chest: Effort normal and breath sounds normal.  Abdominal: Soft. Bowel sounds are normal. She exhibits no distension. There is no tenderness. There is no rebound and no guarding.  Musculoskeletal:  Trace lower extremity edema  Neurological: She is alert. She has normal reflexes. No cranial nerve deficit.  Skin: Skin is warm and dry. She is not diaphoretic.  Psychiatric: She has a normal mood and affect.  Vitals reviewed.  She knows the month and the year but not day of week. Cannot think of the President of the [place]'s name.       Assessment & Plan:  Dementia  Essential hypertension  Dependent edema  Recent fall without injury  Hearing loss  Osteoarthritis right knee  Health maintenance-flu vaccine given  Plan: Continue same  medications. Lab work drawn and pending. Return in 6 months.  Subjective:   Patient presents for Medicare Annual/Subsequent preventive examination.  Review Past Medical/Family/Social:  See above Risk Factors  Current exercise habits: Sedentary Dietary issues discussed: Eats candy and drinks [facility] but has 2 good meals a day  Cardiac risk factors:Essential hypertension  Depression  Screen  (Note: if answer to either of the following is "Yes", a more complete depression screening is indicated)   Over the past two weeks, have you felt down, depressed or hopeless? No  Over the past two weeks, have you felt little interest or pleasure in doing things? No Have you lost interest or pleasure in daily life?Yes due to age and difficulty ambulating Do you often feel hopeless? Sometimes Do you cry easily over simple problems? No   Activities of Daily Living  In your present state of health, do you have any difficulty performing the following activities?:   Driving? Does not drive Managing money? No  Feeding yourself? No  Getting from bed to chair? Yes-uses a walker Climbing a flight of stairs? Yes Preparing food and eating?: No  Bathing or showering? Yes only base once a week Getting dressed: Yes Getting to the toilet? No  Using the toilet:No  Moving around from place to place: Yes S2 use a walker In the past year have you fallen or had a near fall?: Yes Are you sexually active? No  Do you have more than one partner? No   Hearing Difficulties: No  Do you often ask people to speak up or repeat themselves?Yes  Do you experience ringing or noises in your ears? No  Do you have difficulty understanding soft or whispered voices? yes Do you feel that you have a problem with memory?Yes Do you often misplace items? Yes   Home Safety:  Do you have a smoke alarm at your residence? Yes Do you have grab bars in the bathroom?Yes Do you have throw rugs in your house? Yes   Cognitive Testing  Alert? Yes Normal Appearance?Yes  Oriented to person? Yes Place? Yes  Time? Yes But not day of week Recall of three objects? No Can perform simple calculations? No Displays appropriate judgment?Yes  Can read the correct time from a watch face?Yes  Repeats her self a lot List the Names of Other Physician/Practitioners you currently use:  See referral list for the physicians patient  is currently seeing.     Review of Systems: See above   Objective:     General appearance: Appears stated age and mildly obese  Head: Normocephalic, without obvious abnormality, atraumatic  Eyes: conj clear, EOMi PEERLA  Ears: normal TM's and external ear canals both ears  Nose: Nares normal. Septum midline. Mucosa normal. No drainage or sinus tenderness.  Throat: lips, mucosa, and tongue normal; teeth and gums normal  Neck: no adenopathy, no carotid bruit, no JVD, supple, symmetrical, trachea midline and thyroid not enlarged, symmetric, no tenderness/mass/nodules  No CVA tenderness.  Lungs: clear to auscultation bilaterally  Breasts: normal appearance, no masses or tenderness,  Heart: regular rate and rhythm, S1, S2 normal, no murmur, click, rub or gallop  Abdomen: soft, non-tender; bowel sounds normal; no masses, no organomegaly  Musculoskeletal: ROM normal in all joints, no crepitus, no deformity, Normal muscle strengthen. Back  is symmetric, no curvature. Skin: Skin color, texture, turgor normal. No rashes or lesions  Lymph nodes: Cervical, supraclavicular, and axillary nodes normal.  Neurologic: CN 2 -12 Normal, Normal symmetric reflexes. Normal  coordination and gait  Psych: Alert & Oriented x 3, Mood appear stable.    Assessment:    Annual wellness medicare exam   Plan:    During the course of the visit the patient was educated and counseled about appropriate screening and preventive services including:   Annual flu vaccine  Discuss Prevnar 13  at next  visit     Patient Instructions (the written plan) was given to the patient.  Medicare Attestation  I have personally reviewed:  The patient's medical and social history  Their use of alcohol, tobacco or illicit drugs  Their current medications and supplements  The patient's functional ability including ADLs,fall risks, home safety risks, cognitive, and hearing and visual impairment  Diet and physical activities   Evidence for depression or mood disorders  The patient's weight, height, BMI, and visual acuity have been recorded in the chart. I have made referrals, counseling, and provided education to the patient based on review of the above and I have provided the patient with a written personalized care plan for preventive services.

## 2016-03-13 NOTE — Patient Instructions (Signed)
Continue same medications and return in 6 months.  Flu vaccine given today. 

## 2016-03-14 LAB — VITAMIN D 25 HYDROXY (VIT D DEFICIENCY, FRACTURES): Vit D, 25-Hydroxy: 49 ng/mL (ref 30–100)

## 2016-03-14 LAB — FOLATE: Folate: 11.7 ng/mL (ref >5.4)

## 2016-03-14 LAB — VITAMIN B12: VITAMIN B 12: 165 pg/mL — AB (ref 200–1100)

## 2016-03-15 LAB — URINE CULTURE

## 2016-03-15 LAB — IRON AND TIBC
%SAT: 13 % (ref 11–50)
Iron: 38 ug/dL — ABNORMAL LOW (ref 45–160)
TIBC: 303 ug/dL (ref 250–450)
UIBC: 265 ug/dL (ref 125–400)

## 2016-03-16 ENCOUNTER — Other Ambulatory Visit: Payer: Self-pay

## 2016-03-22 ENCOUNTER — Telehealth: Payer: Self-pay | Admitting: Internal Medicine

## 2016-03-22 ENCOUNTER — Observation Stay (HOSPITAL_COMMUNITY)
Admission: EM | Admit: 2016-03-22 | Discharge: 2016-03-24 | Disposition: A | Discharge status: Skilled Nursing Facility | Payer: Medicare Other | Attending: Internal Medicine | Admitting: Internal Medicine

## 2016-03-22 ENCOUNTER — Encounter (HOSPITAL_COMMUNITY): Payer: Self-pay | Admitting: Emergency Medicine

## 2016-03-22 ENCOUNTER — Encounter: Payer: Self-pay | Admitting: Internal Medicine

## 2016-03-22 ENCOUNTER — Emergency Department (HOSPITAL_COMMUNITY): Payer: Medicare Other

## 2016-03-22 DIAGNOSIS — D72829 Elevated white blood cell count, unspecified: Secondary | ICD-10-CM | POA: Diagnosis not present

## 2016-03-22 DIAGNOSIS — I6522 Occlusion and stenosis of left carotid artery: Secondary | ICD-10-CM | POA: Insufficient documentation

## 2016-03-22 DIAGNOSIS — E860 Dehydration: Secondary | ICD-10-CM | POA: Diagnosis not present

## 2016-03-22 DIAGNOSIS — Z90710 Acquired absence of both cervix and uterus: Secondary | ICD-10-CM | POA: Diagnosis not present

## 2016-03-22 DIAGNOSIS — D649 Anemia, unspecified: Secondary | ICD-10-CM | POA: Diagnosis not present

## 2016-03-22 DIAGNOSIS — Z79899 Other long term (current) drug therapy: Secondary | ICD-10-CM | POA: Diagnosis not present

## 2016-03-22 DIAGNOSIS — R609 Edema, unspecified: Secondary | ICD-10-CM | POA: Diagnosis not present

## 2016-03-22 DIAGNOSIS — Z91048 Other nonmedicinal substance allergy status: Secondary | ICD-10-CM | POA: Insufficient documentation

## 2016-03-22 DIAGNOSIS — Z8249 Family history of ischemic heart disease and other diseases of the circulatory system: Secondary | ICD-10-CM | POA: Diagnosis not present

## 2016-03-22 DIAGNOSIS — E538 Deficiency of other specified B group vitamins: Secondary | ICD-10-CM | POA: Insufficient documentation

## 2016-03-22 DIAGNOSIS — I10 Essential (primary) hypertension: Secondary | ICD-10-CM | POA: Diagnosis present

## 2016-03-22 DIAGNOSIS — R404 Transient alteration of awareness: Secondary | ICD-10-CM | POA: Diagnosis not present

## 2016-03-22 DIAGNOSIS — R531 Weakness: Secondary | ICD-10-CM | POA: Diagnosis not present

## 2016-03-22 DIAGNOSIS — Z7982 Long term (current) use of aspirin: Secondary | ICD-10-CM | POA: Diagnosis not present

## 2016-03-22 DIAGNOSIS — E871 Hypo-osmolality and hyponatremia: Secondary | ICD-10-CM | POA: Diagnosis not present

## 2016-03-22 LAB — URINALYSIS, ROUTINE W REFLEX MICROSCOPIC
BILIRUBIN URINE: NEGATIVE
Glucose, UA: NEGATIVE mg/dL
Hgb urine dipstick: NEGATIVE
KETONES UR: NEGATIVE mg/dL
LEUKOCYTES UA: NEGATIVE
NITRITE: NEGATIVE
PH: 6 (ref 5.0–8.0)
PROTEIN: NEGATIVE mg/dL
Specific Gravity, Urine: 1.013 (ref 1.005–1.030)

## 2016-03-22 LAB — CBC
HEMATOCRIT: 27.5 % — AB (ref 36.0–46.0)
Hemoglobin: 9 g/dL — ABNORMAL LOW (ref 12.0–15.0)
MCH: 30.2 pg (ref 26.0–34.0)
MCHC: 32.7 g/dL (ref 30.0–36.0)
MCV: 92.3 fL (ref 78.0–100.0)
PLATELETS: 289 10*3/uL (ref 150–400)
RBC: 2.98 MIL/uL — ABNORMAL LOW (ref 3.87–5.11)
RDW: 13.2 % (ref 11.5–15.5)
WBC: 12.1 10*3/uL — AB (ref 4.0–10.5)

## 2016-03-22 LAB — BASIC METABOLIC PANEL
Anion gap: 8 (ref 5–15)
BUN: 24 mg/dL — AB (ref 6–20)
CALCIUM: 8.4 mg/dL — AB (ref 8.9–10.3)
CO2: 23 mmol/L (ref 22–32)
CREATININE: 0.99 mg/dL (ref 0.44–1.00)
Chloride: 102 mmol/L (ref 101–111)
GFR calc Af Amer: 54 mL/min — ABNORMAL LOW (ref >60)
GFR, EST NON AFRICAN AMERICAN: 47 mL/min — AB (ref >60)
GLUCOSE: 146 mg/dL — AB (ref 65–99)
POTASSIUM: 4.1 mmol/L (ref 3.5–5.1)
SODIUM: 133 mmol/L — AB (ref 135–145)

## 2016-03-22 LAB — I-STAT TROPONIN, ED: Troponin i, poc: 0 ng/mL (ref 0.00–0.08)

## 2016-03-22 LAB — CBG MONITORING, ED: Glucose-Capillary: 134 mg/dL — ABNORMAL HIGH (ref 65–99)

## 2016-03-22 MED ORDER — ASPIRIN 81 MG PO CHEW
81.0000 mg | CHEWABLE_TABLET | Freq: Every day | ORAL | Status: DC
  Administered 2016-03-23 – 2016-03-24 (×2): 81 mg via ORAL
  Filled 2016-03-22 (×2): qty 1

## 2016-03-22 MED ORDER — ENOXAPARIN SODIUM 30 MG/0.3ML ~~LOC~~ SOLN
30.0000 mg | SUBCUTANEOUS | Status: DC
  Administered 2016-03-23 – 2016-03-24 (×2): 30 mg via SUBCUTANEOUS
  Filled 2016-03-22 (×2): qty 0.3

## 2016-03-22 MED ORDER — POLYETHYLENE GLYCOL 3350 17 G PO PACK: 17.0000 g | PACK | Freq: Every day | ORAL | Status: DC | PRN

## 2016-03-22 MED ORDER — HYDROCODONE-ACETAMINOPHEN 5-325 MG PO TABS: 1.0000 | ORAL_TABLET | ORAL | Status: DC | PRN

## 2016-03-22 MED ORDER — ONDANSETRON HCL 4 MG/2ML IJ SOLN: 4.0000 mg | Freq: Four times a day (QID) | INTRAMUSCULAR | Status: DC | PRN

## 2016-03-22 MED ORDER — SODIUM CHLORIDE 0.9 % IV SOLN
INTRAVENOUS | Status: DC
  Administered 2016-03-23: 04:00:00 via INTRAVENOUS

## 2016-03-22 MED ORDER — ONDANSETRON HCL 4 MG PO TABS: 4.0000 mg | ORAL_TABLET | Freq: Four times a day (QID) | ORAL | Status: DC | PRN

## 2016-03-22 MED ORDER — VITAMIN D 1000 UNITS PO TABS
1000.0000 [IU] | ORAL_TABLET | Freq: Every day | ORAL | Status: DC
  Administered 2016-03-23 – 2016-03-24 (×2): 1000 [IU] via ORAL
  Filled 2016-03-22 (×2): qty 1

## 2016-03-22 MED ORDER — ACETAMINOPHEN 325 MG PO TABS: 650.0000 mg | ORAL_TABLET | Freq: Four times a day (QID) | ORAL | Status: DC | PRN

## 2016-03-22 MED ORDER — NAPROXEN 250 MG PO TABS
250.0000 mg | ORAL_TABLET | Freq: Two times a day (BID) | ORAL | Status: DC
  Administered 2016-03-23 – 2016-03-24 (×3): 250 mg via ORAL
  Filled 2016-03-22 (×3): qty 1

## 2016-03-22 MED ORDER — ACETAMINOPHEN 650 MG RE SUPP: 650.0000 mg | Freq: Four times a day (QID) | RECTAL | Status: DC | PRN

## 2016-03-22 NOTE — Telephone Encounter (Signed)
Spoke with son, Dominica Severin and advised that he should take his Mother (patient) to the ED per Dr. Renold Genta.  She has significant weakness.  Son states that she went to the bathroom and she could not get up from the toilet.  He and his brother had to help her get up from the toilet.    Earlier today, we received a call from family stating that this patient had passed out.  [facility] was with patient giving her a bath and it was not made clear if patient had an irregular pulse, whether she was sat up too quickly, no vital signs were provided, there was a lack of information given.  For this reason, we were unable to provide any further instructions.  This is why the son was calling back this evening to give an update.    Therefore, Dr. Renold Genta is saying there has been an evidence of change since her visit in the office last week.  And for this reason, the patient should be taken to the ED to be evaluated.  Son states that when they call the ambulance to the house, she states she doesn't want to go anywhere and the ambulance leaves.  Dr. Renold Genta advised to put the patient in the car and take her to the ED for evaluation.    Son verbalized an understanding of these instructions.

## 2016-03-22 NOTE — ED Notes (Signed)
Admitting Md a the bedside.

## 2016-03-22 NOTE — H&P (Signed)
History and Physical    Brooke Boyd [id number] DOB: 02-17-1918 DOA: 03/22/2016  PCP: Elby Showers, MD   Patient coming from: Home   Chief Complaint: Generalized weakness   HPI: Brooke Boyd is a 82 y.o. female with medical history significant for hypertension, left carotid stenosis, and anemia attributed to B12 deficiency who presents the emergency department with generalized weakness. Patient has reportedly been in her usual state of health, living alone with assistance from aides during the day, until she became increasingly weak over the past 4 days or so. Prior to that, she had been ambulating with a walker but over the past 4 days has become too weak to ambulate despite a walker or intensive assistance from her sons. Today, she was so weak that she was unable to get up from the toilet and a home health aide struggled to help her up. She has been unable to get out of bed since that time due to weakness. Patient denies any headaches, focal numbness or weakness, confusion, or loss of coordination. There had been a fall approximately one week ago, and another a week prior to that, but no significant injuries were reported and the patient denies hitting her head. She denies chest pain, palpitations, dyspnea, or cough. There has been no dysuria or change in urination. She denies abdominal pain, nausea, vomiting, or diarrhea.  ED Course: Upon arrival to the ED, patient is found to be afebrile, saturating adequately on room air, and with vital signs otherwise stable. EKG features a sinus rhythm with left axis deviation and chest x-ray is negative for edema or consolidation. Chemistry panel features a mild hyponatremia with sodium of 133, as well as elevated he went to creatinine ratio. CBC features a stable normocytic anemia with hemoglobin of 9.0 and a leukocytosis to 12,100. Troponin is undetectable and urinalysis is unremarkable. Patient remained hemodynamically stable in the ED and in no  respiratory distress. She required maximal assistance to get up from bed. Given that she lives alone and is no longer able to care for herself, she will be observed on the medical/surgical unit with physical therapy and social work consultations in the morning.  Review of Systems:  All other systems reviewed and apart from HPI, are negative.  Past Medical History:  Diagnosis Date  . Hypertension   . Left carotid stenosis     Past Surgical History:  Procedure Laterality Date  . ABDOMINAL HYSTERECTOMY    . EYE SURGERY  12/2009   cataract     reports that she has never smoked. She has never used smokeless tobacco. She reports that she does not drink alcohol or use drugs.  Allergies  Allergen Reactions  . Tape Other (See Comments)    PATIENT'S SKIN IS VERY THIN; TAPE WILL BRUISE AND TEAR THE SKIN    Family History  Problem Relation Age of Onset  . Heart disease Mother      Prior to Admission medications   Medication Sig Start Date End Date Taking? Authorizing Provider  aspirin 81 MG tablet Take 81 mg by mouth daily.     Yes Historical Provider, MD  Cholecalciferol (VITAMIN D-3) 1000 units CAPS Take 1 capsule by mouth daily.   Yes Historical Provider, MD  cyanocobalamin (,VITAMIN B-12,) 1000 MCG/ML injection Inject 1,000 mcg into the muscle See admin instructions. Once a day for 7 consecutive days then once a week (length of time undetermined) then (eventually) once a month   Yes Historical Provider, MD  furosemide (  LASIX) 20 MG tablet Take 1 tablet (20 mg total) by mouth daily. 01/17/16  Yes Elby Showers, MD  naproxen sodium (ANAPROX) 220 MG tablet Take 220 mg by mouth 2 (two) times daily with a meal.   Yes Historical Provider, MD  potassium chloride SA (K-DUR,KLOR-CON) 20 MEQ tablet Take 2 tablets (40 mEq total) by mouth daily. 10/18/15  Yes Elby Showers, MD    Physical Exam: Vitals:   03/22/16 2200 03/22/16 2215 03/22/16 2230 03/22/16 2245  BP: 146/67 135/64 128/64 134/55    Pulse: 91 95 94 96  Resp: 21 21 19 17   Temp:      TempSrc:      SpO2: 92% 93% 93% 93%  Weight:      Height:          Constitutional: NAD, calm, comfortable Eyes: PERTLA, lids and conjunctivae normal ENMT: Mucous membranes are dry. Posterior pharynx clear of any exudate or lesions.   Neck: normal, supple, no masses, no thyromegaly Respiratory: clear to auscultation bilaterally, no wheezing, no crackles. Normal respiratory effort.  Cardiovascular: S1 & S2 heard, regular rate and rhythm. No significant JVD. Abdomen: No distension, no tenderness, no masses palpated. Bowel sounds normal.  Musculoskeletal: no clubbing / cyanosis. No joint deformity upper and lower extremities.    Skin: no significant rashes, lesions, ulcers. Warm, dry, well-perfused. Poor turgor.  Neurologic: CN 2-12 grossly intact. Sensation intact, DTR normal. Gross hearing impairment. Psychiatric: Normal judgment and insight. Alert and oriented x 3. Normal mood and affect.     Labs on Admission: I have personally reviewed following labs and imaging studies  CBC:  Recent Labs Lab 03/22/16 2013  WBC 12.1*  HGB 9.0*  HCT 27.5*  MCV 92.3  PLT [id number]   Basic Metabolic Panel:  Recent Labs Lab 03/22/16 2013  NA 133*  K 4.1  CL 102  CO2 23  GLUCOSE 146*  BUN 24*  CREATININE 0.99  CALCIUM 8.4*   GFR: Estimated Creatinine Clearance: 20.9 mL/min (by C-G formula based on SCr of 0.99 mg/dL). Liver Function Tests: No results for input(s): AST, ALT, ALKPHOS, BILITOT, PROT, ALBUMIN in the last 168 hours. No results for input(s): LIPASE, AMYLASE in the last 168 hours. No results for input(s): AMMONIA in the last 168 hours. Coagulation Profile: No results for input(s): INR, PROTIME in the last 168 hours. Cardiac Enzymes: No results for input(s): CKTOTAL, CKMB, CKMBINDEX, TROPONINI in the last 168 hours. BNP (last 3 results) No results for input(s): PROBNP in the last 8760 hours. HbA1C: No results for  input(s): HGBA1C in the last 72 hours. CBG:  Recent Labs Lab 03/22/16 2159  GLUCAP 134*   Lipid Profile: No results for input(s): CHOL, HDL, LDLCALC, TRIG, CHOLHDL, LDLDIRECT in the last 72 hours. Thyroid Function Tests: No results for input(s): TSH, T4TOTAL, FREET4, T3FREE, THYROIDAB in the last 72 hours. Anemia Panel: No results for input(s): VITAMINB12, FOLATE, FERRITIN, TIBC, IRON, RETICCTPCT in the last 72 hours. Urine analysis:    Component Value Date/Time   COLORURINE YELLOW 03/22/2016 2116   APPEARANCEUR CLEAR 03/22/2016 2116   LABSPEC 1.013 03/22/2016 2116   PHURINE 6.0 03/22/2016 2116   GLUCOSEU NEGATIVE 03/22/2016 2116   HGBUR NEGATIVE 03/22/2016 2116   BILIRUBINUR NEGATIVE 03/22/2016 2116   BILIRUBINUR neg 03/13/2016 1130   KETONESUR NEGATIVE 03/22/2016 2116   PROTEINUR NEGATIVE 03/22/2016 2116   UROBILINOGEN 0.2 03/13/2016 1130   NITRITE NEGATIVE 03/22/2016 2116   LEUKOCYTESUR NEGATIVE 03/22/2016 2116   Sepsis Labs: @LABRCNTIP (procalcitonin:4,lacticidven:4) )  Recent Results (from the past 240 hour(s))  Urine culture     Status: None   Collection Time: 03/13/16 11:49 AM  Result Value Ref Range Status   Organism ID, Bacteria   Final    Three or more organisms present,each greater than 10,000 CFU/mL.These organisms,commonly found on external and internal genitalia,are considered to be colonizers.No further testing performed.     Comment: Urine specimens that have urine cultures ordered, or might be reflexed to urine culture, should be submitted in Urine C&S transport gray top tubes. Supply order number is: [id number]. Urines that are <5 mL total volume may be submitted in a sterile, leak proof container for culture. Urines that are >5 mL volume (routine urines) that are not submitted in Urine C&S transport gray top tubes are not acceptable for culture and may be rejected If you have any questions, please contact your [facility] Account Representative  directly, or call our Customer Service Department at [phone]      Radiological Exams on Admission: Dg Chest Port 1 View  Result Date: 03/22/2016 CLINICAL DATA:  Generalized weakness.  Hypertension. EXAM: PORTABLE CHEST 1 VIEW COMPARISON:  None. FINDINGS: There is no edema or consolidation. Heart size and pulmonary vascularity are normal. No adenopathy. Bones are diffusely osteoporotic. IMPRESSION: No edema or consolidation. Electronically Signed   By: Lowella Grip III M.D.   On: 03/22/2016 21:09    EKG: Independently reviewed. Sinus rhythm, LAD   Assessment/Plan  1. Generalized weakness  - There is no focal neurologic deficit - There is mild hyponatremia, but no significant electrolyte abnormality - B12 was mildly low with PCP recently and she started parenteral supplementation  - TSH was checked by PCP one week ago and WNL  - There is a mild leukocytosis, but no fever, no evidence for PNA or UTI, and no GI sxs  - There is a mild dehydration and she will receive gentle IVF hydration  - This may be age-related; the patient believes it is and has no complaints  - Ms. Blight lives alone, but is unable to get out of bed d/t weakness at this time and will be observed overnight in the hospital  - PT and social work consultations are requested  2. Normocytic anemia  - Hgb 9.0 on admission without s/s of bleeding  - She was noted to have a mild B12 deficiency by her PCP and has started injections   3. Hyponatremia  - Serum sodium 133 in the setting of dehydration  - Hold Lasix and provide gentle IVF hydration overnight  - Repeat chem panel in am   4. Leukocytosis  - WBC 12,100 on admission with no fever or apparent source of infection  - Plan to culture if febrile, but otherwise watch off of abx for now    5. Hypertension   - At goal currently  - She takes Lasix for dependent edema, but this is held on admission in light of apparent dehydration  - Will monitor and  institute treatment prn    DVT prophylaxis: sq Lovenox  Code Status: Full  Family Communication: Sons and a daughter-in-law updated at bedside at patient's request  Disposition Plan: Observe on med-surg Consults called: None Admission status: Observation    Vianne Bulls, MD [facility] Pager [phone]  If 7PM-7AM, please contact night-coverage [email or web address] Password [email or web address]  03/22/2016, 11:35 PM

## 2016-03-22 NOTE — Telephone Encounter (Signed)
Phone call from son, Brooke Boyd indicating patient apparently had syncopal episode while staff from [facility] was there today. He's not sure of the circumstances. Thinks it was when they were attempting to give her a bath. He was wondering if the anemia could calls this. I suppose she could have vasovagal type syncope. Explained him she could've had a cardiac arrhythmia or perhaps a neurological event. She is now on B-12 injections for B-12 deficiency and iron tablets for iron deficiency. She continues to stay alone part of the day. Advised that he may want to take her to the hospital. Says she does not want to go to the hospital and doesn't feel that she will go in for a: Ambulance. Says he's going to go over there and assess the situation and will call back.  4:30 PM-he called back and spoke with Sharyn Lull. He and his brother and not sure what to do with her. She was having some issues getting up from the toilet. Advise Sharyn Lull to tell him that she does not need to be left alone. They can either stay with her or take her to the hospital for evaluation.

## 2016-03-22 NOTE — ED Provider Notes (Signed)
[facility] DEPT Provider Note   CSN: [id number] Arrival date & time: 03/22/16  1936     History   Chief Complaint Chief Complaint  Patient presents with  . Weakness    HPI KEIANDRA HULTON is a 82 y.o. female. He presents for evaluation with 2 of her sons. She lives and apparently. She does have a lay person the caused by her home to help her during the day at times. The last 3-4 days is really difficult to being weak. Tonight even with assistance of her sons and her walker she cannot ambulate. She could not get onto her off of the toilet herself. Had a fall 2 weeks ago and then again 1 week ago where she was on the floor for more than an hour. No apparent injuries. Has not had vomiting or diarrhea. No melena pain. Has not any more confused than her baseline. Functions a high level cognitively at 96.  HPI  Past Medical History:  Diagnosis Date  . Hypertension   . Left carotid stenosis     Patient Active Problem List   Diagnosis Date Noted  . Generalized weakness 03/22/2016  . Normocytic anemia 03/22/2016  . Hyponatremia 03/22/2016  . Leucocytosis 03/22/2016  . Weakness 03/22/2016  . Unspecified arthropathy, shoulder region 01/09/2013  . Hearing loss 07/04/2011  . Dependent edema 07/04/2011  . Hypertension 01/03/2011  . Osteoarthritis 01/03/2011    Past Surgical History:  Procedure Laterality Date  . ABDOMINAL HYSTERECTOMY    . EYE SURGERY  12/2009   cataract    OB History    No data available       Home Medications    Prior to Admission medications   Medication Sig Start Date End Date Taking? Authorizing Provider  aspirin 81 MG tablet Take 81 mg by mouth daily.     Yes Historical Provider, MD  Cholecalciferol (VITAMIN D-3) 1000 units CAPS Take 1 capsule by mouth daily.   Yes Historical Provider, MD  cyanocobalamin (,VITAMIN B-12,) 1000 MCG/ML injection Inject 1,000 mcg into the muscle See admin instructions. Once a day for 7 consecutive days then once a  week (length of time undetermined) then (eventually) once a month   Yes Historical Provider, MD  furosemide (LASIX) 20 MG tablet Take 1 tablet (20 mg total) by mouth daily. 01/17/16  Yes Elby Showers, MD  naproxen sodium (ANAPROX) 220 MG tablet Take 220 mg by mouth 2 (two) times daily with a meal.   Yes Historical Provider, MD  potassium chloride SA (K-DUR,KLOR-CON) 20 MEQ tablet Take 2 tablets (40 mEq total) by mouth daily. 10/18/15  Yes Elby Showers, MD    Family History Family History  Problem Relation Age of Onset  . Heart disease Mother     Social History Social History  Substance Use Topics  . Smoking status: Never Smoker  . Smokeless tobacco: Never Used  . Alcohol use No     Allergies   Tape   Review of Systems Review of Systems  Constitutional: Negative for appetite change, chills, diaphoresis, fatigue and fever.  HENT: Negative for mouth sores, sore throat and trouble swallowing.   Eyes: Negative for visual disturbance.  Respiratory: Negative for cough, chest tightness, shortness of breath and wheezing.   Cardiovascular: Negative for chest pain.  Gastrointestinal: Negative for abdominal distention, abdominal pain, diarrhea, nausea and vomiting.  Endocrine: Negative for polydipsia, polyphagia and polyuria.  Genitourinary: Negative for dysuria, frequency and hematuria.  Musculoskeletal: Negative for gait problem.  Skin:  Negative for color change, pallor and rash.  Neurological: Positive for weakness. Negative for dizziness, syncope, light-headedness and headaches.  Hematological: Does not bruise/bleed easily.  Psychiatric/Behavioral: Negative for behavioral problems and confusion.     Physical Exam Updated Vital Signs BP 134/55   Pulse 96   Temp 98.9 F (37.2 C) (Rectal)   Resp 17   Ht 4\' 8"  (1.422 m)   Wt 100 lb (45.4 kg)   SpO2 93%   BMI 22.42 kg/m   Physical Exam  Constitutional: She is oriented to person, place, and time. She appears well-developed  and well-nourished. No distress.  Appears elderly thin and frail. No acute distress.  HENT:  Head: Normocephalic.  Eyes: Conjunctivae are normal. Pupils are equal, round, and reactive to light. No scleral icterus.  Neck: Normal range of motion. Neck supple. No thyromegaly present.  Cardiovascular: Normal rate and regular rhythm.  Exam reveals no gallop and no friction rub.   No murmur heard. Pulmonary/Chest: Effort normal and breath sounds normal. No respiratory distress. She has no wheezes. She has no rales.  Abdominal: Soft. Bowel sounds are normal. She exhibits no distension. There is no tenderness. There is no rebound.  Musculoskeletal: Normal range of motion.  Neurological: She is alert and oriented to person, place, and time.  Skin: Skin is warm and dry. No rash noted.  Psychiatric: She has a normal mood and affect. Her behavior is normal.     ED Treatments / Results  Labs (all labs ordered are listed, but only abnormal results are displayed) Labs Reviewed  BASIC METABOLIC PANEL - Abnormal; Notable for the following:       Result Value   Sodium 133 (*)    Glucose, Bld 146 (*)    BUN 24 (*)    Calcium 8.4 (*)    GFR calc non Af Amer 47 (*)    GFR calc Af Amer 54 (*)    All other components within normal limits  CBC - Abnormal; Notable for the following:    WBC 12.1 (*)    RBC 2.98 (*)    Hemoglobin 9.0 (*)    HCT 27.5 (*)    All other components within normal limits  CBG MONITORING, ED - Abnormal; Notable for the following:    Glucose-Capillary 134 (*)    All other components within normal limits  URINALYSIS, ROUTINE W REFLEX MICROSCOPIC (NOT AT [facility])  Randolm Idol, ED    EKG  EKG Interpretation  Date/Time:  Wednesday March 22 2016 19:44:41 EST Ventricular Rate:  96 PR Interval:    QRS Duration: 104 QT Interval:  379 QTC Calculation: 479 R Axis:   -38 Text Interpretation:  Sinus rhythm Left axis deviation Anterior infarct, old Confirmed by Jeneen Rinks  MD,  [place] ([id number]) on 03/22/2016 8:44:04 PM       Radiology Dg Chest Port 1 View  Result Date: 03/22/2016 CLINICAL DATA:  Generalized weakness.  Hypertension. EXAM: PORTABLE CHEST 1 VIEW COMPARISON:  None. FINDINGS: There is no edema or consolidation. Heart size and pulmonary vascularity are normal. No adenopathy. Bones are diffusely osteoporotic. IMPRESSION: No edema or consolidation. Electronically Signed   By: Lowella Grip III M.D.   On: 03/22/2016 21:09    Procedures Procedures (including critical care time)  Medications Ordered in ED Medications - No data to display   Initial Impression / Assessment and Plan / ED Course  I have reviewed the triage vital signs and the nursing notes.  Pertinent labs & imaging results  that were available during my care of the patient were reviewed by me and considered in my medical decision making (see chart for details).  Clinical Course     No apparent cause for her weakness is truly unable to sit independently or stand. Discussed the case with Dr. Myna Hidalgo. Patient is to be admitted. She will require physical therapy occupational therapy evaluations  Final Clinical Impressions(s) / ED Diagnoses   Final diagnoses:  Weakness    New Prescriptions New Prescriptions   No medications on file     Tanna Furry, MD 03/22/16 2316

## 2016-03-22 NOTE — ED Triage Notes (Signed)
Pt brought to ED by [facility] from home for increase weakness getting progressively worse for the past  Weeks pt lives at home along, some care giver visiting her, usually walks with a walker, unable to get up from bed today, Pt AO x 4, had a fall 2 weeks ago, EMS VS BP 136/62, HR 91, SPO2 97% RA, SR on monitor.

## 2016-03-23 DIAGNOSIS — E871 Hypo-osmolality and hyponatremia: Secondary | ICD-10-CM | POA: Diagnosis not present

## 2016-03-23 DIAGNOSIS — R531 Weakness: Secondary | ICD-10-CM | POA: Diagnosis not present

## 2016-03-23 DIAGNOSIS — I10 Essential (primary) hypertension: Secondary | ICD-10-CM | POA: Diagnosis not present

## 2016-03-23 LAB — CBC
HEMATOCRIT: 27.4 % — AB (ref 36.0–46.0)
HEMOGLOBIN: 8.9 g/dL — AB (ref 12.0–15.0)
MCH: 30.2 pg (ref 26.0–34.0)
MCHC: 32.5 g/dL (ref 30.0–36.0)
MCV: 92.9 fL (ref 78.0–100.0)
Platelets: 288 10*3/uL (ref 150–400)
RBC: 2.95 MIL/uL — ABNORMAL LOW (ref 3.87–5.11)
RDW: 13 % (ref 11.5–15.5)
WBC: 8.1 10*3/uL (ref 4.0–10.5)

## 2016-03-23 LAB — BASIC METABOLIC PANEL
ANION GAP: 10 (ref 5–15)
BUN: 21 mg/dL — AB (ref 6–20)
CHLORIDE: 101 mmol/L (ref 101–111)
CO2: 25 mmol/L (ref 22–32)
Calcium: 8.6 mg/dL — ABNORMAL LOW (ref 8.9–10.3)
Creatinine, Ser: 0.98 mg/dL (ref 0.44–1.00)
GFR calc Af Amer: 55 mL/min — ABNORMAL LOW (ref >60)
GFR calc non Af Amer: 47 mL/min — ABNORMAL LOW (ref >60)
GLUCOSE: 122 mg/dL — AB (ref 65–99)
POTASSIUM: 4 mmol/L (ref 3.5–5.1)
Sodium: 136 mmol/L (ref 135–145)

## 2016-03-23 MED ORDER — CYANOCOBALAMIN 1000 MCG/ML IJ SOLN
1000.0000 ug | Freq: Every day | INTRAMUSCULAR | Status: DC
  Administered 2016-03-23 – 2016-03-24 (×2): 1000 ug via INTRAMUSCULAR
  Filled 2016-03-23 (×2): qty 1

## 2016-03-23 NOTE — Progress Notes (Signed)
I received a call from pt's son, Posey Jacobe, to inquire into a possible inpt rehab admission private pay. I discussed our venue as related to 3 hrs per day of therapy with a length of stay average for 10- 12 days. I explained that pt under Medicare guidelines not a candidate for she is under observation status currently in the hospital. That private pay for inpt rehab would be an estimated daily cost of care of $3035 per day. He chooses to not pursue this rehab venue at this time. I will update RN CM and SW. [phone]

## 2016-03-23 NOTE — Care Management Note (Signed)
Case Management Note  Patient Details  Name: Brooke Boyd MRN: [id number] Date of Birth: 07-Oct-1917  Subjective/Objective:  Pt admitted with weakness. She is from home alone.  Action/Plan: Awaiting PT/OT recommendations. CM following for d/c needs.   Expected Discharge Date:                  Expected Discharge Plan:     In-House Referral:     Discharge planning Services     Post Acute Care Choice:    Choice offered to:     DME Arranged:    DME Agency:     HH Arranged:    HH Agency:     Status of Service:  In process, will continue to follow  If discussed at Long Length of Stay Meetings, dates discussed:    Additional Comments:  Pollie Friar, RN 03/23/2016, 10:33 AM

## 2016-03-23 NOTE — ED Notes (Signed)
Pt AO x 4, c/o generalized weakness for the past 2 weeks getting worse today, no neuro deficit noticed, denies any pain, pass her SWS, family member at the bedside, NAD noticed. CBG 134, BUN 24 no UTI present, chest x ray negative. WBC 12.1, Hg 9.0, Hct 27.5. Pt had a fall at home 2 weeks ago, pt high fall risk on the hospital, usually ambulates using a walker no been able to walk for the past few days.

## 2016-03-23 NOTE — Evaluation (Signed)
Physical Therapy Evaluation Patient Details Name: Brooke Boyd MRN: [id number] DOB: 03/17/1918 Today's Date: 03/23/2016   History of Present Illness  82 y.o.femalewith medical history significant for hypertension, left carotid stenosis, and anemia attributed to B12 deficiency who presents the emergency department with generalized weakness. She became increasingly weak over the past 4 days or so. Day of admission, she was so weak that she was unable to get up from the toilet and her sons struggled to get her up. All medical workup negative.    Clinical Impression  Pt currently with functional limitations due to the deficits listed below (see PT Problem List). She has had recent sharp decline in her strength and functional status (modified independent with rolling walker within past 4 weeks, to currently unable to take a single step with 2person assist and RW). Pt may benefit from skilled PT to increase their independence and safety with mobility. A trial of PT is certainly warranted to determine if she can improve her strength and mobility.      Follow Up Recommendations SNF;Supervision/Assistance - 24 hour (Son understands that she is not safe to be alone at this time. He was educated that SNF would provide maximum therapies, however would currently require private-pay due to insurance regulations. Educated son that other options are home with 24 hour care (currently would be cared for at bed level due to cannot safely transfer to [facility] with 1 person assist) vs assisted living facility with therapies at lesser frequency/intensity.   Discharge plan discussed with [facility], Case manager. Son has questions re: cost of options. Vida Roller has contacted Social Work so they may address his questions.    Financial risk analyst; Wheelchair cushion; 3in1; Hospital bed; hoyer lift; medical transport (all if d/c home with 24 hour care)    Recommendations for Other Services OT consult      Precautions / Restrictions Precautions Precautions: Fall Precaution Comments: high fall risk Restrictions Weight Bearing Restrictions: No      Mobility  Bed Mobility Overal bed mobility: Needs Assistance;+2 for physical assistance Bed Mobility: Supine to Sit;Sit to Supine;Rolling Rolling: Min assist   Supine to sit: Min assist;HOB elevated (rail) Sit to supine: Mod assist   General bed mobility comments: very slow and effortful; incr vc for technique to incr ease; using bedrail  Transfers Overall transfer level: Needs assistance Equipment used: Rolling walker (2 wheeled) Transfers: Sit to/from Stand Sit to Stand: Mod assist;+2 physical assistance         General transfer comment: stood twice at EOB with seated rest between (4 minutes); assist for initiating lift off and completely coming to stand  Ambulation/Gait             General Gait Details: pt unable to [facility] each foot to advance and step (even with assist for weight-shifting, she could not advance her leg)  Stairs            Wheelchair Mobility    Modified Rankin (Stroke Patients Only)       Balance Overall balance assessment: Needs assistance;History of Falls Sitting-balance support: Bilateral upper extremity supported;Feet supported Sitting balance-Leahy Scale: Poor Sitting balance - Comments: begins slow lean backwards while talking with no awareness that she is leaning; required mod assist to return to upright Postural control: Posterior lean Standing balance support: Bilateral upper extremity supported Standing balance-Leahy Scale: Poor Standing balance comment: 2 person assist with RW to stand for orthostatic BP (~2 minutes)  Pertinent Vitals/Pain See vitals flow sheet. Orthostatics negative  Pain Assessment: No/denies pain    Home Living Family/patient expects to be discharged to:: Private residence Living Arrangements: Alone Available  Help at Discharge: Family;Neighbor;Personal care attendant (2 local sons)           Home Equipment: Walker - 2 wheels Additional Comments: interview interrupted multiple times and full home environment not obtained    Prior Function Level of Independence: Needs assistance   Gait / Transfers Assistance Needed: Until ~4 weeks ago, walking modified independent with RW short distances in her home. (does not leave home) Fell ~2 weeks ago (unwitnessed; pt reported in kitchen & turned to reach for RW with fall). Continued decline since fall  ADL's / Homemaking Assistance Needed: aide 1x/week for housework; sons share going by every morning to check on her; neighbor provides meals ~3-4 times/week        Hand Dominance        Extremity/Trunk Assessment   Upper Extremity Assessment: Generalized weakness;Defer to OT evaluation           Lower Extremity Assessment: Generalized weakness (requires assist to stand, bring legs up onto bed)      Cervical / Trunk Assessment: Kyphotic  Communication   Communication: HOH  Cognition Arousal/Alertness: Awake/alert Behavior During Therapy: WFL for tasks assessed/performed Overall Cognitive Status: Impaired/Different from baseline Area of Impairment: Memory;Awareness     Memory: Decreased short-term memory     Awareness: Intellectual   General Comments: son eludes to progressive decr "sharpness" in her thinking; reports currently asked about relatives that have been dead for many years    General Comments General comments (skin integrity, edema, etc.): Son present and frequently corrected information pt provided. Long discussion re: need for 24 hour care (and at bed level if only one person available) if returns home vs paying out of pocket for SNF vs ALF (son wants her to receive therapies with hope for improvement)    Exercises General Exercises - Lower Extremity Ankle Circles/Pumps: AROM;Both;15 reps Long Arc Quad: AROM;Both;10  reps;Seated   Assessment/Plan    PT Assessment Patient needs continued PT services  PT Problem List Decreased strength;Decreased activity tolerance;Decreased balance;Decreased mobility;Decreased cognition;Decreased knowledge of use of DME;Decreased safety awareness;Decreased knowledge of precautions          PT Treatment Interventions DME instruction;Gait training;Functional mobility training;Therapeutic activities;Therapeutic exercise;Balance training;Cognitive remediation;Patient/family education    PT Goals (Current goals can be found in the Care Plan section)  Acute Rehab PT Goals Patient Stated Goal: do the things she enjoys--do her puzzles, watch tv PT Goal Formulation: With patient/family Time For Goal Achievement: 04/06/16 Potential to Achieve Goals: Fair    Frequency Min 3X/week (until d/c plan finalized; ?pt will insist on home per son)   Barriers to discharge Decreased caregiver support currently does not have 24/7 assist    Co-evaluation               End of Session Equipment Utilized During Treatment: Gait belt Activity Tolerance: Patient limited by fatigue Patient left: in bed;with call bell/phone within reach;with bed alarm set;with family/visitor present Nurse Communication: Mobility status;Need for lift equipment;Other (comment) (difficult d/c plan due to observation)    Functional Assessment Tool Used: clinical judgement Functional Limitation: Mobility: Walking and moving around Mobility: Walking and Moving Around Current Status [id number]): At least 80 percent but less than 100 percent impaired, limited or restricted Mobility: Walking and Moving Around Goal Status [phone] At least 20 percent but less than  40 percent impaired, limited or restricted    Time: 1115-1208 PT Time Calculation (min) (ACUTE ONLY): 53 min   Charges:   PT Evaluation $PT Eval Moderate Complexity: 1 Procedure PT Treatments $Therapeutic Activity: 8-22 mins   PT G Codes:   PT  G-Codes **NOT FOR INPATIENT CLASS** Functional Assessment Tool Used: clinical judgement Functional Limitation: Mobility: Walking and moving around Mobility: Walking and Moving Around Current Status [id number]): At least 80 percent but less than 100 percent impaired, limited or restricted Mobility: Walking and Moving Around Goal Status [phone]): At least 20 percent but less than 40 percent impaired, limited or restricted    Sundance Moise 03/23/2016, 12:49 PM  Pager [phone]

## 2016-03-23 NOTE — Care Management Note (Signed)
Case Management Note  Patient Details  Name: Brooke Boyd MRN: [id number] Date of Birth: 1918/03/20  Subjective/Objective:                    Action/Plan: CM spoke at length with the patient and her 2 sons about patient being in observation status and what that means for discharge planning. Pt has Medicare and does not have a qualifying diagnosis for inpatient status and she does not have 3 midnights under inpatient status. CM inquired about what their plans are and they are leaning toward SNF rehab with a goal of returning home. CM informed them that at this time SNF rehab will be self pay. CM also informed them about home health services and private duty aides if she were to return home. Son stated that she has had [facility] services in the past but she ended up firing them before their services were complete. CM offered private duty understanding that it is private pay but they are more interested in self pay SNF rehab. They are interested in the patient staying in the [place] area. CM spoke with CSW and informed her of the discussion with the family. CSW is going to fax her out and provided CM with a list of SNF in the [place] area for the family. CM gave list to family and CSW to meet with them this afternoon. CM continuing to follow.   Expected Discharge Date:                  Expected Discharge Plan:  [place]  In-House Referral:     Discharge planning Services     Post Acute Care Choice:    Choice offered to:     DME Arranged:    DME Agency:     HH Arranged:    [place] Agency:     Status of Service:  In process, will continue to follow  If discussed at Long Length of Stay Meetings, dates discussed:    Additional Comments:  Pollie Friar, RN 03/23/2016, 2:59 PM

## 2016-03-23 NOTE — NC FL2 (Signed)
[place] MEDICAID FL2 LEVEL OF CARE SCREENING TOOL     IDENTIFICATION  Patient Name: Brooke Boyd Birthdate: 21-Sep-1917 Sex: female Admission Date (Current Location): 03/22/2016  [facility] and [place] Number:  Herbalist and Address:  [facility], [place] [address], [place], [place] [address]      Provider Number: [id number]  Attending Physician Name and Address:  Geradine Girt, DO  Relative Name and Phone Number:       Current Level of Care: Hospital Recommended Level of Care: [place] Prior Approval Number:    Date Approved/Denied:   PASRR Number: [id number]  Discharge Plan: SNF    Current Diagnoses: Patient Active Problem List   Diagnosis Date Noted  . Generalized weakness 03/22/2016  . Normocytic anemia 03/22/2016  . Hyponatremia 03/22/2016  . Leucocytosis 03/22/2016  . Weakness 03/22/2016  . Unspecified arthropathy, shoulder region 01/09/2013  . Hearing loss 07/04/2011  . Dependent edema 07/04/2011  . Hypertension 01/03/2011  . Osteoarthritis 01/03/2011    Orientation RESPIRATION BLADDER Height & Weight     Self, Time, Situation, Place  Normal Incontinent Weight: 101 lb 10.1 oz (46.1 kg) Height:  4\' 8"  (142.2 cm)  BEHAVIORAL SYMPTOMS/MOOD NEUROLOGICAL BOWEL NUTRITION STATUS      Incontinent Diet (Regular Diet, Thin Liquids)  AMBULATORY STATUS COMMUNICATION OF NEEDS Skin   Extensive Assist Verbally Normal                       Personal Care Assistance Level of Assistance  Bathing, Feeding, Dressing Bathing Assistance: Limited assistance Feeding assistance: Independent Dressing Assistance: Limited assistance     Functional Limitations Info  Sight, Hearing, Speech Sight Info: Adequate Hearing Info: Adequate Speech Info: Adequate    SPECIAL CARE FACTORS FREQUENCY  PT (By licensed PT), OT (By licensed OT)     PT Frequency: 5 OT Frequency: 5            Contractures Contractures  Info: Not present    Additional Factors Info  Code Status, Allergies Code Status Info: Full Code Allergies Info: Tape           Current Medications (03/23/2016):  This is the current hospital active medication list Current Facility-Administered Medications  Medication Dose Route Frequency Provider Last Rate Last Dose  . acetaminophen (TYLENOL) tablet 650 mg  650 mg Oral Q6H PRN Vianne Bulls, MD       Or  . acetaminophen (TYLENOL) suppository 650 mg  650 mg Rectal Q6H PRN Vianne Bulls, MD      . aspirin chewable tablet 81 mg  81 mg Oral Daily Vianne Bulls, MD   81 mg at 03/23/16 1005  . cholecalciferol (VITAMIN D) tablet 1,000 Units  1,000 Units Oral Daily Vianne Bulls, MD   1,000 Units at 03/23/16 1005  . cyanocobalamin ((VITAMIN B-12)) injection 1,000 mcg  1,000 mcg Intramuscular Daily Tomi Bamberger Vann, DO   1,000 mcg at 03/23/16 1002  . enoxaparin (LOVENOX) injection 30 mg  30 mg Subcutaneous Q24H Ilene Qua Opyd, MD   30 mg at 03/23/16 1003  . HYDROcodone-acetaminophen (NORCO/VICODIN) 5-325 MG per tablet 1-2 tablet  1-2 tablet Oral Q4H PRN Vianne Bulls, MD      . naproxen (NAPROSYN) tablet 250 mg  250 mg Oral BID WC Vianne Bulls, MD   250 mg at 03/23/16 0805  . ondansetron (ZOFRAN) tablet 4 mg  4 mg Oral Q6H PRN Christia Reading  S Opyd, MD       Or  . ondansetron (ZOFRAN) injection 4 mg  4 mg Intravenous Q6H PRN Timothy S Opyd, MD      . polyethylene glycol (MIRALAX / GLYCOLAX) packet 17 g  17 g Oral Daily PRN Vianne Bulls, MD         Discharge Medications: Please see discharge summary for a list of discharge medications.  Relevant Imaging Results:  Relevant Lab Results:   Additional Information SSN:  [id number]  Darden Dates, LCSW

## 2016-03-23 NOTE — Care Management Obs Status (Signed)
[place] NOTIFICATION   Patient Details  Name: Brooke Boyd MRN: [id number] Date of Birth: 12-Dec-1917   Medicare Observation Status Notification Given:  Yes    Pollie Friar, RN 03/23/2016, 2:55 PM

## 2016-03-23 NOTE — Clinical Social Work Placement (Signed)
   CLINICAL SOCIAL WORK PLACEMENT  NOTE  Date:  03/23/2016  Patient Details  Name: CAMMI KLUVER MRN: [id number] Date of Birth: [birth year]  Clinical Social Work is seeking post-discharge placement for this patient at the [place] level of care (*CSW will initial, date and re-position this form in  chart as items are completed):  Yes   Patient/family provided with [place] Work Department's list of facilities offering this level of care within the geographic area requested by the patient (or if unable, by the patient's family).  Yes   Patient/family informed of their freedom to choose among providers that offer the needed level of care, that participate in Medicare, Medicaid or managed care program needed by the patient, have an available bed and are willing to accept the patient.  Yes   Patient/family informed of [facility]'s ownership interest in [facility] and [facility], as well as of the fact that they are under no obligation to receive care at these facilities.  PASRR submitted to EDS on 03/23/16     PASRR number received on 03/23/16     Existing PASRR number confirmed on       FL2 transmitted to all facilities in geographic area requested by pt/family on 03/23/16     FL2 transmitted to all facilities within larger geographic area on       Patient informed that his/her managed care company has contracts with or will negotiate with certain facilities, including the following:            Patient/family informed of bed offers received.  Patient chooses bed at       Physician recommends and patient chooses bed at      Patient to be transferred to   on  .  Patient to be transferred to facility by       Patient family notified on   of transfer.  Name of family member notified:        PHYSICIAN       Additional Comment:    _______________________________________________ Darden Dates, LCSW 03/23/2016, 3:11 PM

## 2016-03-23 NOTE — Progress Notes (Signed)
PROGRESS NOTE    Brooke Boyd  [id number] DOB: [birth year] DOA: 03/22/2016 PCP: Elby Showers, MD   Outpatient Specialists:     Brief Narrative:  Patient admitted with increasing weakness.  Recent diagnosis of B12 deficiency and started on B12 shots    Assessment & Plan:   Principal Problem:   Generalized weakness Active Problems:   Hypertension   Dependent edema   Normocytic anemia   Hyponatremia   Leucocytosis   Weakness   Weakness- suspect due to severe B12 deficiency work up done by PCP Patient has not pain -no infection source found -PT Eval-ortho checks -family considering private pay for SNF  -resume B12 shots (3rd day) -Hgb not low enough for transfusion  Anemia -presumed due to B12 def -monitor     DVT prophylaxis:  Lovenox   Code Status: Full Code   Family Communication: son  Disposition Plan:  PT eval pending   Consultants:      Subjective: Patient states she feels fine and wants to go home  Objective: Vitals:   03/22/16 2245 03/23/16 0058 03/23/16 0525 03/23/16 0937  BP: 134/55 (!) 157/64 106/82 (!) 126/45  Pulse: 96 100 92 89  Resp: 17 18 18 18   Temp:  97.6 F (36.4 C) 97.7 F (36.5 C) 98.2 F (36.8 C)  TempSrc:  Oral Oral Oral  SpO2: 93% 100% 100% 99%  Weight:  46.1 kg (101 lb 10.1 oz)    Height:  4\' 8"  (1.422 m)      Intake/Output Summary (Last 24 hours) at 03/23/16 1143 Last data filed at 03/23/16 0900  Gross per 24 hour  Intake              480 ml  Output              250 ml  Net              230 ml   Filed Weights   03/22/16 1943 03/23/16 0058  Weight: 45.4 kg (100 lb) 46.1 kg (101 lb 10.1 oz)    Examination:  General exam: Appears calm and comfortable -- hard of hearing Respiratory system: Clear to auscultation. Respiratory effort normal. Cardiovascular system: S1 & S2 heard, RRR. No JVD, murmurs, rubs, gallops or clicks. No pedal edema. Gastrointestinal system: Abdomen is nondistended, soft  and nontender. No organomegaly or masses felt. Normal bowel sounds heard.     Data Reviewed: I have personally reviewed following labs and imaging studies  CBC:  Recent Labs Lab 03/22/16 2013 03/23/16 0848  WBC 12.1* 8.1  HGB 9.0* 8.9*  HCT 27.5* 27.4*  MCV 92.3 92.9  PLT 289 [id number]   Basic Metabolic Panel:  Recent Labs Lab 03/22/16 2013 03/23/16 0339  NA 133* 136  K 4.1 4.0  CL 102 101  CO2 23 25  GLUCOSE 146* 122*  BUN 24* 21*  CREATININE 0.99 0.98  CALCIUM 8.4* 8.6*   GFR: Estimated Creatinine Clearance: 21.3 mL/min (by C-G formula based on SCr of 0.98 mg/dL). Liver Function Tests: No results for input(s): AST, ALT, ALKPHOS, BILITOT, PROT, ALBUMIN in the last 168 hours. No results for input(s): LIPASE, AMYLASE in the last 168 hours. No results for input(s): AMMONIA in the last 168 hours. Coagulation Profile: No results for input(s): INR, PROTIME in the last 168 hours. Cardiac Enzymes: No results for input(s): CKTOTAL, CKMB, CKMBINDEX, TROPONINI in the last 168 hours. BNP (last 3 results) No results for input(s): PROBNP in the last 8760 hours. HbA1C: No  results for input(s): HGBA1C in the last 72 hours. CBG:  Recent Labs Lab 03/22/16 2159  GLUCAP 134*   Lipid Profile: No results for input(s): CHOL, HDL, LDLCALC, TRIG, CHOLHDL, LDLDIRECT in the last 72 hours. Thyroid Function Tests: No results for input(s): TSH, T4TOTAL, FREET4, T3FREE, THYROIDAB in the last 72 hours. Anemia Panel: No results for input(s): VITAMINB12, FOLATE, FERRITIN, TIBC, IRON, RETICCTPCT in the last 72 hours. Urine analysis:    Component Value Date/Time   COLORURINE YELLOW 03/22/2016 2116   APPEARANCEUR CLEAR 03/22/2016 2116   LABSPEC 1.013 03/22/2016 2116   PHURINE 6.0 03/22/2016 2116   GLUCOSEU NEGATIVE 03/22/2016 2116   HGBUR NEGATIVE 03/22/2016 2116   BILIRUBINUR NEGATIVE 03/22/2016 2116   BILIRUBINUR neg 03/13/2016 1130   KETONESUR NEGATIVE 03/22/2016 2116   PROTEINUR  NEGATIVE 03/22/2016 2116   UROBILINOGEN 0.2 03/13/2016 1130   NITRITE NEGATIVE 03/22/2016 2116   LEUKOCYTESUR NEGATIVE 03/22/2016 2116     ) Recent Results (from the past 240 hour(s))  Urine culture     Status: None   Collection Time: 03/13/16 11:49 AM  Result Value Ref Range Status   Organism ID, Bacteria   Final    Three or more organisms present,each greater than 10,000 CFU/mL.These organisms,commonly found on external and internal genitalia,are considered to be colonizers.No further testing performed.     Comment: Urine specimens that have urine cultures ordered, or might be reflexed to urine culture, should be submitted in Urine C&S transport gray top tubes. Supply order number is: [id number]. Urines that are <5 mL total volume may be submitted in a sterile, leak proof container for culture. Urines that are >5 mL volume (routine urines) that are not submitted in Urine C&S transport gray top tubes are not acceptable for culture and may be rejected If you have any questions, please contact your [facility] Account Representative directly, or call our Customer Service Department at [phone]       Anti-infectives    None       Radiology Studies: Dg Chest Port 1 View  Result Date: 03/22/2016 CLINICAL DATA:  Generalized weakness.  Hypertension. EXAM: PORTABLE CHEST 1 VIEW COMPARISON:  None. FINDINGS: There is no edema or consolidation. Heart size and pulmonary vascularity are normal. No adenopathy. Bones are diffusely osteoporotic. IMPRESSION: No edema or consolidation. Electronically Signed   By: Lowella Grip III M.D.   On: 03/22/2016 21:09        Scheduled Meds: . aspirin  81 mg Oral Daily  . cholecalciferol  1,000 Units Oral Daily  . cyanocobalamin  1,000 mcg Intramuscular Daily  . enoxaparin (LOVENOX) injection  30 mg Subcutaneous Q24H  . naproxen  250 mg Oral BID WC   Continuous Infusions:   LOS: 0 days    Time spent: 25 min    [place], DO [facility] Pager [phone]  If 7PM-7AM, please contact night-coverage [email or web address] Password [email or web address] 03/23/2016, 11:43 AM

## 2016-03-24 DIAGNOSIS — R2689 Other abnormalities of gait and mobility: Secondary | ICD-10-CM | POA: Diagnosis not present

## 2016-03-24 DIAGNOSIS — R531 Weakness: Secondary | ICD-10-CM | POA: Diagnosis not present

## 2016-03-24 DIAGNOSIS — R296 Repeated falls: Secondary | ICD-10-CM | POA: Diagnosis not present

## 2016-03-24 LAB — CK: Total CK: 54 U/L (ref 38–234)

## 2016-03-24 LAB — BASIC METABOLIC PANEL
Anion gap: 6 (ref 5–15)
BUN: 19 mg/dL (ref 6–20)
CALCIUM: 8.1 mg/dL — AB (ref 8.9–10.3)
CHLORIDE: 106 mmol/L (ref 101–111)
CO2: 24 mmol/L (ref 22–32)
CREATININE: 0.99 mg/dL (ref 0.44–1.00)
GFR calc Af Amer: 54 mL/min — ABNORMAL LOW (ref >60)
GFR calc non Af Amer: 47 mL/min — ABNORMAL LOW (ref >60)
GLUCOSE: 95 mg/dL (ref 65–99)
Potassium: 3.7 mmol/L (ref 3.5–5.1)
Sodium: 136 mmol/L (ref 135–145)

## 2016-03-24 LAB — CBC
HEMATOCRIT: 25.1 % — AB (ref 36.0–46.0)
HEMOGLOBIN: 8.1 g/dL — AB (ref 12.0–15.0)
MCH: 29.8 pg (ref 26.0–34.0)
MCHC: 32.3 g/dL (ref 30.0–36.0)
MCV: 92.3 fL (ref 78.0–100.0)
Platelets: 283 10*3/uL (ref 150–400)
RBC: 2.72 MIL/uL — ABNORMAL LOW (ref 3.87–5.11)
RDW: 13.1 % (ref 11.5–15.5)
WBC: 7.6 10*3/uL (ref 4.0–10.5)

## 2016-03-24 MED ORDER — FERROUS SULFATE 325 (65 FE) MG PO TABS: 325.0000 mg | ORAL_TABLET | Freq: Every day | ORAL | Status: DC

## 2016-03-24 MED ORDER — FOLIC ACID 1 MG PO TABS: 1.0000 mg | ORAL_TABLET | Freq: Every day | ORAL | 0 refills | Status: AC

## 2016-03-24 MED ORDER — FERROUS SULFATE 325 (65 FE) MG PO TBEC: 325.0000 mg | DELAYED_RELEASE_TABLET | Freq: Every day | ORAL | 0 refills | Status: AC

## 2016-03-24 MED ORDER — POLYETHYLENE GLYCOL 3350 17 G PO PACK: 17.0000 g | PACK | Freq: Every day | ORAL | 0 refills | Status: AC | PRN

## 2016-03-24 MED ORDER — FOLIC ACID 1 MG PO TABS
1.0000 mg | ORAL_TABLET | Freq: Every day | ORAL | Status: DC
  Administered 2016-03-24: 1 mg via ORAL
  Filled 2016-03-24: qty 1

## 2016-03-24 MED ORDER — FUROSEMIDE 20 MG PO TABS: 20.0000 mg | ORAL_TABLET | Freq: Every day | ORAL | 0 refills | Status: AC | PRN

## 2016-03-24 NOTE — Clinical Social Work Placement (Signed)
   CLINICAL SOCIAL WORK PLACEMENT  NOTE  Date:  03/24/2016  Patient Details  Name: Brooke Boyd MRN: [id number] Date of Birth: [birth year]  Clinical Social Work is seeking post-discharge placement for this patient at the [place] level of care (*CSW will initial, date and re-position this form in  chart as items are completed):  Yes   Patient/family provided with [place] Work Department's list of facilities offering this level of care within the geographic area requested by the patient (or if unable, by the patient's family).  Yes   Patient/family informed of their freedom to choose among providers that offer the needed level of care, that participate in Medicare, Medicaid or managed care program needed by the patient, have an available bed and are willing to accept the patient.  Yes   Patient/family informed of [facility]'s ownership interest in [facility] and [facility], as well as of the fact that they are under no obligation to receive care at these facilities.  PASRR submitted to EDS on 03/23/16     PASRR number received on 03/23/16     Existing PASRR number confirmed on       FL2 transmitted to all facilities in geographic area requested by pt/family on 03/23/16     FL2 transmitted to all facilities within larger geographic area on       Patient informed that his/her managed care company has contracts with or will negotiate with certain facilities, including the following:        Yes   Patient/family informed of bed offers received.  Patient chooses bed at [facility]     Physician recommends and patient chooses bed at      Patient to be transferred to [facility] on 03/24/16.  Patient to be transferred to facility by [facility]     Patient family notified on 03/24/16 of transfer.  Name of family member notified:  Pt's son, Dominica Severin     PHYSICIAN       Additional Comment:     _______________________________________________ Darden Dates, LCSW 03/24/2016, 11:32 AM

## 2016-03-24 NOTE — Discharge Summary (Signed)
[facility] Discharge Summary   Patient: Brooke Boyd [id number]   PCP: Elby Showers, MD DOB: 24-May-1917   Date of admission: 03/22/2016   Date of discharge:  03/24/2016    Discharge Diagnoses:  Principal Problem:   Generalized weakness Active Problems:   Hypertension   Dependent edema   Normocytic anemia   Hyponatremia   Leucocytosis   Weakness   Admitted From: home Disposition:  SNF  Recommendations for Outpatient Follow-up:  1. Please follow up with PCP in 1 week with CBC   Follow-up Information    Elby Showers, MD. Schedule an appointment as soon as possible for a visit in 1 week(s).   Specialty:  Internal Medicine Contact information: [id number] [phone]          Diet recommendation: regular diet  Activity: The patient is advised to gradually reintroduce usual activities.  Discharge Condition: good  Code Status: full code  History of present illness: As per the H and P dictated on admission, "DESHAWNDA MECHEM is a 82 y.o. female with medical history significant for hypertension, left carotid stenosis, and anemia attributed to B12 deficiency who presents the emergency department with generalized weakness. Patient has reportedly been in her usual state of health, living alone with assistance from aides during the day, until she became increasingly weak over the past 4 days or so. Prior to that, she had been ambulating with a walker but over the past 4 days has become too weak to ambulate despite a walker or intensive assistance from her sons. Today, she was so weak that she was unable to get up from the toilet and a home health aide struggled to help her up. She has been unable to get out of bed since that time due to weakness. Patient denies any headaches, focal numbness or weakness, confusion, or loss of coordination. There had been a fall approximately one week ago, and another a week prior to that, but no  significant injuries were reported and the patient denies hitting her head. She denies chest pain, palpitations, dyspnea, or cough. There has been no dysuria or change in urination. She denies abdominal pain, nausea, vomiting, or diarrhea."  Hospital Course:  Summary of her active problems in the hospital is as following.  Generalized weakness. Normocytic anemia. Vitamin B-12 deficiency. Patient was admitted with generalized weakness. Workup was unremarkable. Patient was thought to be slightly dehydrated on admission. Patient does have anemia with MCV of 92 and severe vitamin B-12 deficiency. Likely anemia is contributing to patient's weakness which in turn is secondary to poor oral intake. At present I recommend to start the patient on iron supplementation as well as folic acid supplementation. Recommend to continue the B-12 injection as recommended by PCP, once a day for one week and then once a week for 1 month and then every month. Recommend to recheck CBC in 1 week to ensure stabilization. Also recommend to discontinue naproxen which can lead to gastritis and worsening of anemia. Therefore Tylenol for pain control.  Essential hypertension. Patient is on Lasix daily scheduled. Recommend to change it to only as needed.  All other chronic medical condition were stable during the hospitalization.  Patient was seen by physical therapy, who recommended SNF, which was arranged by Education officer, museum and case Freight forwarder. On the day of the discharge the patient's vitals were stable, and no other acute medical condition were reported by patient. the patient was felt safe to be discharge at SNF with  therapy.  Procedures and Results:  none   Consultations:  none  DISCHARGE MEDICATION: Current Discharge Medication List    START taking these medications   Details  ferrous sulfate (FE TABS) 325 (65 FE) MG EC tablet Take 1 tablet (325 mg total) by mouth daily with breakfast. Qty: 180 tablet,  Refills: 0    folic acid (FOLVITE) 1 MG tablet Take 1 tablet (1 mg total) by mouth daily. Qty: 30 tablet, Refills: 0    polyethylene glycol (MIRALAX / GLYCOLAX) packet Take 17 g by mouth daily as needed for mild constipation. Qty: 14 each, Refills: 0      CONTINUE these medications which have CHANGED   Details  furosemide (LASIX) 20 MG tablet Take 1 tablet (20 mg total) by mouth daily as needed for fluid or edema. Take for weight gain of more than 3Lbs in a day or 5Lbs in 2 days. Qty: 30 tablet, Refills: 0      CONTINUE these medications which have NOT CHANGED   Details  aspirin 81 MG tablet Take 81 mg by mouth daily.      Cholecalciferol (VITAMIN D-3) 1000 units CAPS Take 1 capsule by mouth daily.    cyanocobalamin (,VITAMIN B-12,) 1000 MCG/ML injection Inject 1,000 mcg into the muscle See admin instructions. Once a day for 7 consecutive days then once a week (length of time undetermined) then (eventually) once a month    potassium chloride SA (K-DUR,KLOR-CON) 20 MEQ tablet Take 2 tablets (40 mEq total) by mouth daily. Qty: 60 tablet, Refills: 4      STOP taking these medications     naproxen sodium (ANAPROX) 220 MG tablet        Allergies  Allergen Reactions  . Tape Other (See Comments)    PATIENT'S SKIN IS VERY THIN; TAPE WILL BRUISE AND TEAR THE SKIN   Discharge Instructions    Diet regular    Complete by:  As directed    Discharge instructions    Complete by:  As directed    It is important that you read following instructions as well as go over your medication list with RN to help you understand your care after this hospitalization.  Discharge Instructions: Please follow-up with PCP in one week  Please request your primary care physician to go over all Hospital Tests and Procedure/Radiological results at the follow up,  Please get all Hospital records sent to your PCP by signing hospital release before you go home.   Do not take more than prescribed Pain,  Sleep and Anxiety Medications. You were cared for by a hospitalist during your hospital stay. If you have any questions about your discharge medications or the care you received while you were in the hospital after you are discharged, you can call the unit and ask to speak with the hospitalist on call if the hospitalist that took care of you is not available.  Once you are discharged, your primary care physician will handle any further medical issues. Please note that NO REFILLS for any discharge medications will be authorized once you are discharged, as it is imperative that you return to your primary care physician (or establish a relationship with a primary care physician if you do not have one) for your aftercare needs so that they can reassess your need for medications and monitor your lab values. You Must read complete instructions/literature along with all the possible adverse reactions/side effects for all the Medicines you take and that have been prescribed to  you. Take any new Medicines after you have completely understood and accept all the possible adverse reactions/side effects. Wear Seat belts while driving.   Increase activity slowly    Complete by:  As directed      Discharge Exam: Filed Weights   03/22/16 1943 03/23/16 0058  Weight: 45.4 kg (100 lb) 46.1 kg (101 lb 10.1 oz)   Vitals:   03/24/16 0546 03/24/16 1040  BP: 140/60 (!) 124/55  Pulse: 82 85  Resp: 18 18  Temp: 98.2 F (36.8 C) 98.7 F (37.1 C)   General: Appear in mild distress, NO Rash; Oral Mucosa moist. Cardiovascular: S1 and S2 Present, no Murmur, no JVD Respiratory: Bilateral Air entry present and Clear to Auscultation, no Crackles, no wheezes Abdomen: Bowel Sound present, Soft and no tenderness Extremities: no Pedal edema, no calf tenderness Neurology: Grossly no focal neuro deficit.  The results of significant diagnostics from this hospitalization (including imaging, microbiology, ancillary and  laboratory) are listed below for reference.    Significant Diagnostic Studies: Dg Chest Port 1 View  Result Date: 03/22/2016 CLINICAL DATA:  Generalized weakness.  Hypertension. EXAM: PORTABLE CHEST 1 VIEW COMPARISON:  None. FINDINGS: There is no edema or consolidation. Heart size and pulmonary vascularity are normal. No adenopathy. Bones are diffusely osteoporotic. IMPRESSION: No edema or consolidation. Electronically Signed   By: Lowella Grip III M.D.   On: 03/22/2016 21:09    Microbiology: No results found for this or any previous visit (from the past 240 hour(s)).   Labs: CBC:  Recent Labs Lab 03/22/16 2013 03/23/16 0848 03/24/16 0328  WBC 12.1* 8.1 7.6  HGB 9.0* 8.9* 8.1*  HCT 27.5* 27.4* 25.1*  MCV 92.3 92.9 92.3  PLT 289 288 [id number]   Basic Metabolic Panel:  Recent Labs Lab 03/22/16 2013 03/23/16 0339 03/24/16 0328  NA 133* 136 136  K 4.1 4.0 3.7  CL 102 101 106  CO2 23 25 24   GLUCOSE 146* 122* 95  BUN 24* 21* 19  CREATININE 0.99 0.98 0.99  CALCIUM 8.4* 8.6* 8.1*   Liver Function Tests: No results for input(s): AST, ALT, ALKPHOS, BILITOT, PROT, ALBUMIN in the last 168 hours. No results for input(s): LIPASE, AMYLASE in the last 168 hours. No results for input(s): AMMONIA in the last 168 hours. Cardiac Enzymes:  Recent Labs Lab 03/24/16 0328  CKTOTAL 54   BNP (last 3 results)  Recent Labs  06/24/15 1201  BNP 25.0   CBG:  Recent Labs Lab 03/22/16 2159  GLUCAP 134*   Time spent: 30 minutes  Signed:  Tammie Yanda  [facility]  03/24/2016  , 11:33 AM

## 2016-03-24 NOTE — Progress Notes (Signed)
Report given to nursing supervisor at [facility]

## 2016-03-24 NOTE — Care Management Note (Signed)
Case Management Note  Patient Details  Name: Brooke Boyd MRN: [id number] Date of Birth: June 08, 1917  Subjective/Objective:                    Action/Plan: Pt discharging to [facility] today. No further needs per CM.  Expected Discharge Date:                  Expected Discharge Plan:  Skilled Nursing Facility  In-House Referral:  Clinical Social Work  Discharge planning Services  CM Consult  Post Acute Care Choice:    Choice offered to:     DME Arranged:    DME Agency:     HH Arranged:    [place] Agency:     Status of Service:  Completed, signed off  If discussed at [employer], dates discussed:    Additional Comments:  Pollie Friar, RN 03/24/2016, 11:10 AM

## 2016-03-24 NOTE — Progress Notes (Signed)
Physical Therapy Treatment Patient Details Name: Brooke Boyd MRN: [id number] DOB: 01/27/1918 Today's Date: 03/24/2016    History of Present Illness 82 y.o.femalewith medical history significant for hypertension, left carotid stenosis, and anemia attributed to B12 deficiency who presents the emergency department with generalized weakness. She became increasingly weak over the past 4 days or so. Day of admission, she was so weak that she was unable to get up from the toilet and her sons struggled to get her up. All medical workup negative.    PT Comments    Pt resistant to perform mobility but with encouragement able to transfer patient from bed to chair.  Will continue to advance mobility during hospitalization.  Plan for d/c to SNF today.    Follow Up Recommendations  Supervision/Assistance - 24 hour;SNF     Equipment Recommendations   (TBD at next venue.  )    Recommendations for Other Services       Precautions / Restrictions Precautions Precautions: Fall Precaution Comments: high fall risk Restrictions Weight Bearing Restrictions: No    Mobility  Bed Mobility Overal bed mobility: Needs Assistance       Supine to sit: Mod assist;Max assist     General bed mobility comments: very slow and effortful; incr vc for technique to incr ease; using bedrail.  Pt fatigues and then requires increased assist with use of bed pad.  Kyphotic posturing with posterior lean.    Transfers Overall transfer level: Needs assistance Equipment used: Rolling walker (2 wheeled);None Transfers: Sit to/from [employer] to Stand: Min assist;+2 physical assistance         General transfer comment: Pt performed increased standing trials but refused to ambulate from bed to chair.  Pt stood x2 with RW, on 3rd trial pt stood with therapist in front of patient and tow one step to turn and sit in chair (chair brought closer for stand pivot transfer.     Ambulation/Gait Ambulation/Gait assistance:  (refused ambulation. )               Stairs            Wheelchair Mobility    Modified Rankin (Stroke Patients Only)       Balance Overall balance assessment: Needs assistance   Sitting balance-Leahy Scale: Poor       Standing balance-Leahy Scale: Poor                      Cognition Arousal/Alertness: Awake/alert Behavior During Therapy: WFL for tasks assessed/performed Overall Cognitive Status: Impaired/Different from baseline Area of Impairment: Memory;Awareness     Memory: Decreased short-term memory     Awareness: Intellectual        Exercises      General Comments        Pertinent Vitals/Pain Pain Assessment: Faces Faces Pain Scale: Hurts little more Pain Location: generalized.   Pain Descriptors / Indicators: Grimacing Pain Intervention(s): Monitored during session    Home Living                      Prior Function            PT Goals (current goals can now be found in the care plan section) Acute Rehab PT Goals Patient Stated Goal: do the things she enjoys--do her puzzles, watch tv Potential to Achieve Goals: Fair Progress towards PT goals: Progressing toward goals    Frequency  PT Plan Current plan remains appropriate    Co-evaluation             End of Session Equipment Utilized During Treatment: Gait belt Activity Tolerance: Patient limited by fatigue Patient left: in bed;with call bell/phone within reach;with bed alarm set;with family/visitor present     Time: [id number] PT Time Calculation (min) (ACUTE ONLY): 16 min  Charges:  $Therapeutic Activity: 8-22 mins                    G Codes:      Cristela Blue 27-Mar-2016, 12:22 PM  Governor Rooks, PTA pager [phone]

## 2016-03-24 NOTE — Clinical Social Work Note (Signed)
Pt is ready for discharge today to [facility]. Facility has received discharge information and is ready to admit pt. Pt and family are aware and agreeable to discharge plan. RN called report. [facility] will provide transportation. CSW is signing off as no further needs identified.   Darden Dates, MSW, LCSW Clinical Social Worker  [phone]

## 2016-03-24 NOTE — Progress Notes (Signed)
Occupational Therapy Discharge Patient Details Name: Brooke Boyd MRN: [id number] DOB: 1918/05/03 Today's Date: 03/24/2016 Time:  -     Patient discharged from OT services secondary to Defer to snf. Pending d/c at this date/ time.  Please see latest therapy progress note for current level of functioning and progress toward goals.    Progress and discharge plan discussed with patient and/or caregiver: Patient/Caregiver agrees with plan  GO     Vonita Moss   OTR/L Pager: [phone] Office: [phone] .  03/24/2016, 1:55 PM

## 2016-03-24 NOTE — Clinical Social Work Note (Signed)
Clinical Social Work Assessment  Patient Details  Name: ASSUNTA PUPO MRN: [id number] Date of Birth: February 08, 1918  Date of referral:  03/23/16               Reason for consult:  Facility Placement, Discharge Planning                Permission sought to share information with:  Family Supports Permission granted to share information::  Yes, Verbal Permission Granted  Name::     Gali Spinney  Relationship::  son  Contact Information:  [phone]  Housing/Transportation Living arrangements for the past 2 months:  Apartment Source of Information:  Patient, Adult Children Patient Interpreter Needed:  None Criminal Activity/Legal Involvement Pertinent to Current Situation/Hospitalization:  No - Comment as needed Significant Relationships:  Adult Children Lives with:  Self Do you feel safe going back to the place where you live?  No (Pt is in need of a higher level of care. ) Need for family participation in patient care:  Yes (Comment)  Care giving concerns:  No care giving concerns identified.   Social Worker assessment / plan:  CSW met with pt and sons, Dominica Severin and Marden Noble, to address consult. PT is recommending SNF, however pt is Medicare OBSERVATION, therefore pt will not have her Medicare cover her STR. CSW introduced herself and explained role of social work. CSW also explained process of discharging to SNF as a private pay. Pt's son are able to pay privately at facility. CSW intiated a SNF search and follow up with bed offers. Pt's son chose [facility]. CSW updated facility, and pt's would need to pay for 30 days upfront for room and board. CSW provided the quote. Pt's family is agreeable to discharge plan. CSW will continue to follow to facilitate discharge to [facility] as private pay.   Employment status:  Retired Forensic scientist:  Medicare ([place]) PT Recommendations:  [place] / Referral to community resources:  [place]  Patient/Family's Response to care:  Pt's son was appreciative of CSW support.   Patient/Family's Understanding of and Emotional Response to Diagnosis, Current Treatment, and Prognosis:  Pt and sons understand that pt would benefit from STR at [facility].   Emotional Assessment Appearance:  Appears stated age Attitude/Demeanor/Rapport:  Other (Appropriate) Affect (typically observed):  Accepting, Adaptable, Pleasant Orientation:  Oriented to Self, Oriented to Place, Oriented to  Time, Oriented to Situation Alcohol / Substance use:  Not Applicable Psych involvement (Current and /or in the community):  No (Comment)  Discharge Needs  Concerns to be addressed:  Financial / Insurance Concerns Readmission within the last 30 days:  No Current discharge risk:  None Barriers to Discharge:  Continued Medical Work up   [employer], LCSW 03/24/2016, 11:31 AM

## 2016-03-25 DIAGNOSIS — R2689 Other abnormalities of gait and mobility: Secondary | ICD-10-CM | POA: Diagnosis not present

## 2016-03-25 DIAGNOSIS — R278 Other lack of coordination: Secondary | ICD-10-CM | POA: Diagnosis not present

## 2016-03-25 DIAGNOSIS — M6281 Muscle weakness (generalized): Secondary | ICD-10-CM | POA: Diagnosis not present

## 2016-03-27 ENCOUNTER — Non-Acute Institutional Stay (SKILLED_NURSING_FACILITY): Payer: Medicare Other | Admitting: Adult Health

## 2016-03-27 ENCOUNTER — Encounter: Payer: Self-pay | Admitting: Adult Health

## 2016-03-27 DIAGNOSIS — K5901 Slow transit constipation: Secondary | ICD-10-CM | POA: Diagnosis not present

## 2016-03-27 DIAGNOSIS — D649 Anemia, unspecified: Secondary | ICD-10-CM

## 2016-03-27 DIAGNOSIS — R531 Weakness: Secondary | ICD-10-CM

## 2016-03-27 DIAGNOSIS — E538 Deficiency of other specified B group vitamins: Secondary | ICD-10-CM

## 2016-03-27 DIAGNOSIS — E876 Hypokalemia: Secondary | ICD-10-CM

## 2016-03-27 NOTE — Progress Notes (Signed)
Subjective:      Patient ID: Brooke Boyd, female   DOB: 11/28/1917, 82 y.o.   MRN: [id number]  HPI:  The patient is a 82-YO pleasant female with a past medical history significant for HTN, left carotid stenosis, and anemia attributed to B12 deficiency. She has been admitted to [facility] on 03/24/16 from [facility] hospitalization 03/22/16 to 03/24/16. She was having generalized weakness and fall @ home. She stated that she fell in the kitchen by the refrigerator, but was able to drag herself into the living room where she was able to reach the phone.   He was thought to be slightly dehydrated and anemic due to severe vitamin b12 defciency anemia and is likely contributing to her weakness and, also, secondary to poor oral intake. She has been recommended to continue iron, folic acid and 123456 injections. Naproxen was discontinued which might cause gastritis and worsen anemia.  She was seen in her room and her Doristine Bosworth came to visit her.  She has been admitted for a short-term rehabilitation.   Review of Systems GENERAL:  No fatigue, no weight changes, no fever or chills.   EYES:  Denies change in vision, dry eyes, eye pain, itching or discharge. EARS:  Wears bilateral hearing aids, but son has them currently.  No ringing in ears or earache. NOSE:  Denies nasal congestion or epistaxis. MOUTH AND THROAT:  Denies oral discomfort, gingival pain or bleeding, pain from teeth or hoarseness. RESPIRATORY:  No cough, SOB, DOE, wheezing, or hemoptysis. CARDIAC:  No chest pain or palpitations. GI: No abdominal pain, diarrhea, constipation, heartburn, nausea or vomiting.  States last BM was yesterday. GU:  Denies dysuria, frequency, hematuria, incontinence, or discharge.     Objective:   Physical Exam  GENERAL APPEARANCE:  Well-nourished, in no acute distress. SKIN:  Skin is warm and dry. HEAD:  Normal in size and contour.  No evidence of trauma. EYES:  Lids open and close normally.  No  blepharitis, entropion or ectropion.  PERRL.  Conjunctivae are clear, sclerae are white.  Lenses are without opacity. EARS:  Pinnae are normal.  Hard of hearing, has hearing aid on right ear, states she normally wears bilateral hearing aids but her son has them.   MOUTH AND THROAT:  Lips are without lesions.  Oral mucosa is moist and without lesions.  Tongue is normal in shape, size, and color, and without lesions. NECK:  Supple, trachea midline.  No neck masses, thyroid tenderness or thyromegaly. LYMPHATICS:  No LAD in the neck, no supraclavicular LAN. RESPIRATORY:  Breathing is even and unlabored.  BC CTAB. CARDIAC:  RRR, no murmurs or extra heart sounds. GI:  Abdomen soft, normal BS.  No masses, tenderness, hepatomegaly, or splenomegaly. EXTREMITIES:  Bilateral ankle swelling, 1+.  Able to move X4 extremities.   PSYCHIATRIC:  Alert and oriented x3.       Labs reviewed: Basic Metabolic Panel:  Recent Labs  03/22/16 2013 03/23/16 0339 03/24/16 0328  NA 133* 136 136  K 4.1 4.0 3.7  CL 102 101 106  CO2 23 25 24   GLUCOSE 146* 122* 95  BUN 24* 21* 19  CREATININE 0.99 0.98 0.99  CALCIUM 8.4* 8.6* 8.1*   Liver Function Tests:  Recent Labs  03/13/16 1212  AST 15  ALT 8  ALKPHOS 87  BILITOT 0.5  PROT 6.6  ALBUMIN 3.5*   CBC:  Recent Labs  03/13/16 1212 03/22/16 2013 03/23/16 0848 03/24/16 0328  WBC 7.1 12.1*  8.1 7.6  NEUTROABS 3,692  --   --   --   HGB 9.6* 9.0* 8.9* 8.1*  HCT 29.1* 27.5* 27.4* 25.1*  MCV 93.0 92.3 92.9 92.3  PLT 242 289 288 283   Lipid Panel:  Recent Labs  03/13/16 1212  HDL 46   Cardiac Enzymes:  Recent Labs  03/24/16 0328  CKTOTAL 54   CBG:  Recent Labs  03/22/16 2159  GLUCAP 134*       ASSESSMENT/PLAN:  Generalized weakness - for rehabilitation, PT and OT, for therapeutic strengthening exercises; fall precaution  Normocytic Anemia -  Serum iron level 38 ; continue FeSO4 325 mg 1 tab PO Q D, Folic acid 1 mg 1 tab PO Q D  and Vitamin B12 1,000 mcg injection Q D X 7 days then weekly X 1 month then monthly; check CBC Lab Results  Component Value Date   HGB 8.1 (L) 03/24/2016   Vitamin B12 deficiency - continue Vitamin B12 1,000 mcg injection Q D X 7 days then weekly X 1 month then monthly  Constipation - continue Miralax 17 gm PO Q D PRN  Hypokalemia - continue K-dur 20 meq 2 tabs = 40 meq PO Q D; check BMP Lab Results  Component Value Date   K 3.7 03/24/2016     Goals of care:  Short-term rehabilitation    Kamilla Hands [facility] [phone]

## 2016-03-28 ENCOUNTER — Encounter: Payer: Self-pay | Admitting: Internal Medicine

## 2016-03-28 ENCOUNTER — Non-Acute Institutional Stay (SKILLED_NURSING_FACILITY): Payer: Medicare Other | Admitting: Internal Medicine

## 2016-03-28 DIAGNOSIS — D518 Other vitamin B12 deficiency anemias: Secondary | ICD-10-CM

## 2016-03-28 DIAGNOSIS — R609 Edema, unspecified: Secondary | ICD-10-CM | POA: Diagnosis not present

## 2016-03-28 DIAGNOSIS — R2681 Unsteadiness on feet: Secondary | ICD-10-CM | POA: Diagnosis not present

## 2016-03-28 DIAGNOSIS — M25511 Pain in right shoulder: Secondary | ICD-10-CM | POA: Diagnosis not present

## 2016-03-28 DIAGNOSIS — M6281 Muscle weakness (generalized): Secondary | ICD-10-CM | POA: Diagnosis not present

## 2016-03-28 DIAGNOSIS — M25561 Pain in right knee: Secondary | ICD-10-CM

## 2016-03-28 DIAGNOSIS — D509 Iron deficiency anemia, unspecified: Secondary | ICD-10-CM | POA: Diagnosis not present

## 2016-03-28 DIAGNOSIS — I10 Essential (primary) hypertension: Secondary | ICD-10-CM | POA: Diagnosis not present

## 2016-03-28 DIAGNOSIS — R278 Other lack of coordination: Secondary | ICD-10-CM | POA: Diagnosis not present

## 2016-03-28 DIAGNOSIS — G8929 Other chronic pain: Secondary | ICD-10-CM

## 2016-03-28 DIAGNOSIS — E876 Hypokalemia: Secondary | ICD-10-CM | POA: Diagnosis not present

## 2016-03-28 DIAGNOSIS — R2689 Other abnormalities of gait and mobility: Secondary | ICD-10-CM | POA: Diagnosis not present

## 2016-03-28 DIAGNOSIS — R5381 Other malaise: Secondary | ICD-10-CM | POA: Diagnosis not present

## 2016-03-28 NOTE — Progress Notes (Signed)
LOCATION: [facility]  PCP: Elby Showers, MD   Code Status: DNR  Goals of care: Advanced Directive information Advanced Directives 03/27/2016  Does patient have an advance directive? Yes  Type of Paramedic of [place];Out of facility DNR (pink MOST or yellow form)  Does patient want to make changes to advanced directive? No - Patient declined  Copy of advanced directive(s) in chart? Yes  Would patient like information on creating an advanced directive? -       Extended Emergency Contact Information Primary Emergency Contact: [facility] Address: [address]          [place], [place] [address] Johnnette Litter of [place] Phone: [phone] Mobile Phone: [phone] Relation: Son   Allergies  Allergen Reactions  . Tape Other (See Comments)    PATIENT'S SKIN IS VERY THIN; TAPE WILL BRUISE AND TEAR THE SKIN    Chief Complaint  Patient presents with  . New Admit To SNF    New Admission Visit     HPI:  Patient is a 82 y.o. female seen today for short term rehabilitation post hospital admission from 03/22/16-03/24/16 with generalized weakness and dehydration. She was found to have b12 deficiency and was started on b12 injection. She was also started on folic acid and iron supplement. She is seen in her room today with her son at bedside.   Review of Systems:  Constitutional: Negative for fever, chills, diaphoresis. Feels weak and tired. HENT: Negative for headache, congestion, nasal discharge. She is hard of hearing.  Eyes: Negative for double vision and discharge.  Respiratory: Negative for cough and wheezing. Positive for shortness of breath with exertion.   Cardiovascular: Negative for chest pain, palpitations, leg swelling.  Gastrointestinal: Negative for heartburn, nausea, vomiting, abdominal pain. Last bowel movement was today. Genitourinary: Negative for dysuria and flank pain.  Musculoskeletal: Negative for fall in the facility.  positive for fall at home and has chronic right shoulder and right knee pain Skin: Negative for itching, rash.  Neurological: Positive for occasional dizziness. Psychiatric/Behavioral: Negative for depression   Past Medical History:  Diagnosis Date  . Hypertension   . Left carotid stenosis    Past Surgical History:  Procedure Laterality Date  . ABDOMINAL HYSTERECTOMY    . EYE SURGERY  12/2009   cataract   Social History:   reports that she has never smoked. She has never used smokeless tobacco. She reports that she does not drink alcohol or use drugs.  Family History  Problem Relation Age of Onset  . Heart disease Mother     Medications:   Medication List       Accurate as of 03/28/16 12:36 PM. Always use your most recent med list.          aspirin 81 MG tablet Take 81 mg by mouth daily.   cyanocobalamin 1000 MCG/ML injection Commonly known as:  (VITAMIN B-12) Inject 1,000 mcg into the muscle See admin instructions. Once a day for 7 consecutive days then once a week (length of time undetermined) then (eventually) once a month   ferrous sulfate 325 (65 FE) MG EC tablet Commonly known as:  FE TABS Take 1 tablet (325 mg total) by mouth daily with breakfast.   folic acid 1 MG tablet Commonly known as:  FOLVITE Take 1 tablet (1 mg total) by mouth daily.   furosemide 20 MG tablet Commonly known as:  LASIX Take 1 tablet (20 mg total) by mouth daily as needed for fluid  or edema. Take for weight gain of more than 3Lbs in a day or 5Lbs in 2 days.   polyethylene glycol packet Commonly known as:  MIRALAX / GLYCOLAX Take 17 g by mouth daily as needed for mild constipation.   potassium chloride SA 20 MEQ tablet Commonly known as:  K-DUR,KLOR-CON Take 2 tablets (40 mEq total) by mouth daily.   Vitamin D-3 1000 units Caps Take 1 capsule by mouth daily.       Immunizations: Immunization History  Administered Date(s) Administered  . Influenza Whole 02/13/2004  .  Influenza,inj,Quad PF,36+ Mos 01/09/2013, 01/15/2014, 02/19/2015, 03/13/2016  . Pneumococcal Polysaccharide-23 06/15/2009  . Tdap 09/23/1992, 07/04/2011     Physical Exam:  Vitals:   03/28/16 1233  BP: (!) 160/73  Pulse: 84  Resp: 18  Temp: 97 F (36.1 C)  TempSrc: Oral  SpO2: 97%  Weight: 101 lb 9.6 oz (46.1 kg)  Height: 4\' 8"  (1.422 m)   Body mass index is 22.78 kg/m.  General- elderly female, frail and thin built, in no acute distress Head- normocephalic, atraumatic Nose- no maxillary or frontal sinus tenderness, no nasal discharge Throat- moist mucus membrane Eyes- PERRLA, EOMI, no pallor, no icterus, no discharge, normal conjunctiva, normal sclera Neck- no cervical lymphadenopathy Cardiovascular- normal s1,s2, no murmur, trace leg edema Respiratory- bilateral clear to auscultation, no wheeze, no rhonchi, no crackles, no use of accessory muscles Abdomen- bowel sounds present, soft, non tender Musculoskeletal- able to move all 4 extremities, limited right shoulder ROM, generalized weakness Neurological- alert and oriented to person, place and time Skin- warm and dry, chronic changes to skin on her lower legs Psychiatry- normal mood and affect    Labs reviewed: Basic Metabolic Panel:  Recent Labs  03/22/16 2013 03/23/16 0339 03/24/16 0328  NA 133* 136 136  K 4.1 4.0 3.7  CL 102 101 106  CO2 23 25 24   GLUCOSE 146* 122* 95  BUN 24* 21* 19  CREATININE 0.99 0.98 0.99  CALCIUM 8.4* 8.6* 8.1*   Liver Function Tests:  Recent Labs  03/13/16 1212  AST 15  ALT 8  ALKPHOS 87  BILITOT 0.5  PROT 6.6  ALBUMIN 3.5*   No results for input(s): LIPASE, AMYLASE in the last 8760 hours. No results for input(s): AMMONIA in the last 8760 hours. CBC:  Recent Labs  03/13/16 1212 03/22/16 2013 03/23/16 0848 03/24/16 0328  WBC 7.1 12.1* 8.1 7.6  NEUTROABS 3,692  --   --   --   HGB 9.6* 9.0* 8.9* 8.1*  HCT 29.1* 27.5* 27.4* 25.1*  MCV 93.0 92.3 92.9 92.3  PLT  242 289 288 283   Cardiac Enzymes:  Recent Labs  03/24/16 0328  CKTOTAL 54   BNP: Invalid input(s): POCBNP CBG:  Recent Labs  03/22/16 2159  GLUCAP 134*    Radiological Exams: Dg Chest Port 1 View  Result Date: 03/22/2016 CLINICAL DATA:  Generalized weakness.  Hypertension. EXAM: PORTABLE CHEST 1 VIEW COMPARISON:  None. FINDINGS: There is no edema or consolidation. Heart size and pulmonary vascularity are normal. No adenopathy. Bones are diffusely osteoporotic. IMPRESSION: No edema or consolidation. Electronically Signed   By: Lowella Grip III M.D.   On: 03/22/2016 21:09    Assessment/Plan  Physical deconditioning From generalized weakness. Will have her work with physical therapy and occupational therapy team to help with gait training and muscle strengthening exercises.fall precautions. Skin care. Encourage to be out of bed.  Right shoulder pain Has hx of right rotator cuff injury.  Start tylenol 500 mg tid for pain and biofreeze gel bid and monitor  Right knee pain From severe OA. Tylenol and biofreeze as above and monitor.  Unsteady gait To work with PT and OT for gait training and muscle strengthening, fall precautions  b12 deficiency anemia Continue b12 daily sq injection for a week and then weekly and monthly. Follow b12 level. Also on floate supplement  Iron deficiency anemia On iron supplement, monitor cbc  Hypokalemia Continue kcl supplement with changes as below and monitor bmp  HTN Off all antihypertensives. Monitor BP reading. Continue baby aspirin  Dependent edema Her lasix has been changed to daily as needed. Change kcl supplement to 20 meq po daily as needed when lasix is given. Keep legs elevated at rest    Goals of care: short term rehabilitation   Labs/tests ordered: cbc, bmp   Family/ staff Communication: reviewed care plan with patient, her son and nursing supervisor    Blanchie Serve, MD Internal Medicine [place], [place] [address] Cell Phone (Monday-Friday 8 am - 5 pm): [phone] On Call: [phone] and follow prompts after 5 pm and on weekends Office Phone: [phone] Office Fax: [phone]

## 2016-03-29 DIAGNOSIS — R278 Other lack of coordination: Secondary | ICD-10-CM | POA: Diagnosis not present

## 2016-03-29 DIAGNOSIS — R2689 Other abnormalities of gait and mobility: Secondary | ICD-10-CM | POA: Diagnosis not present

## 2016-03-29 DIAGNOSIS — M6281 Muscle weakness (generalized): Secondary | ICD-10-CM | POA: Diagnosis not present

## 2016-03-30 DIAGNOSIS — R2689 Other abnormalities of gait and mobility: Secondary | ICD-10-CM | POA: Diagnosis not present

## 2016-03-30 DIAGNOSIS — M6281 Muscle weakness (generalized): Secondary | ICD-10-CM | POA: Diagnosis not present

## 2016-03-30 DIAGNOSIS — R278 Other lack of coordination: Secondary | ICD-10-CM | POA: Diagnosis not present

## 2016-03-31 DIAGNOSIS — R2689 Other abnormalities of gait and mobility: Secondary | ICD-10-CM | POA: Diagnosis not present

## 2016-03-31 DIAGNOSIS — M6281 Muscle weakness (generalized): Secondary | ICD-10-CM | POA: Diagnosis not present

## 2016-03-31 DIAGNOSIS — R278 Other lack of coordination: Secondary | ICD-10-CM | POA: Diagnosis not present

## 2016-04-02 DIAGNOSIS — R2689 Other abnormalities of gait and mobility: Secondary | ICD-10-CM | POA: Diagnosis not present

## 2016-04-02 DIAGNOSIS — M6281 Muscle weakness (generalized): Secondary | ICD-10-CM | POA: Diagnosis not present

## 2016-04-02 DIAGNOSIS — R278 Other lack of coordination: Secondary | ICD-10-CM | POA: Diagnosis not present

## 2016-04-03 DIAGNOSIS — R2689 Other abnormalities of gait and mobility: Secondary | ICD-10-CM | POA: Diagnosis not present

## 2016-04-03 DIAGNOSIS — R278 Other lack of coordination: Secondary | ICD-10-CM | POA: Diagnosis not present

## 2016-04-03 DIAGNOSIS — M6281 Muscle weakness (generalized): Secondary | ICD-10-CM | POA: Diagnosis not present

## 2016-04-04 DIAGNOSIS — M6281 Muscle weakness (generalized): Secondary | ICD-10-CM | POA: Diagnosis not present

## 2016-04-04 DIAGNOSIS — R278 Other lack of coordination: Secondary | ICD-10-CM | POA: Diagnosis not present

## 2016-04-04 DIAGNOSIS — R2689 Other abnormalities of gait and mobility: Secondary | ICD-10-CM | POA: Diagnosis not present

## 2016-04-05 DIAGNOSIS — M6281 Muscle weakness (generalized): Secondary | ICD-10-CM | POA: Diagnosis not present

## 2016-04-05 DIAGNOSIS — R278 Other lack of coordination: Secondary | ICD-10-CM | POA: Diagnosis not present

## 2016-04-05 DIAGNOSIS — R2689 Other abnormalities of gait and mobility: Secondary | ICD-10-CM | POA: Diagnosis not present

## 2016-04-07 DIAGNOSIS — R278 Other lack of coordination: Secondary | ICD-10-CM | POA: Diagnosis not present

## 2016-04-07 DIAGNOSIS — M6281 Muscle weakness (generalized): Secondary | ICD-10-CM | POA: Diagnosis not present

## 2016-04-07 DIAGNOSIS — R2689 Other abnormalities of gait and mobility: Secondary | ICD-10-CM | POA: Diagnosis not present

## 2016-04-10 DIAGNOSIS — M6281 Muscle weakness (generalized): Secondary | ICD-10-CM | POA: Diagnosis not present

## 2016-04-10 DIAGNOSIS — R278 Other lack of coordination: Secondary | ICD-10-CM | POA: Diagnosis not present

## 2016-04-10 DIAGNOSIS — R2689 Other abnormalities of gait and mobility: Secondary | ICD-10-CM | POA: Diagnosis not present

## 2016-04-11 DIAGNOSIS — M6281 Muscle weakness (generalized): Secondary | ICD-10-CM | POA: Diagnosis not present

## 2016-04-11 DIAGNOSIS — R2689 Other abnormalities of gait and mobility: Secondary | ICD-10-CM | POA: Diagnosis not present

## 2016-04-11 DIAGNOSIS — R278 Other lack of coordination: Secondary | ICD-10-CM | POA: Diagnosis not present

## 2016-04-12 DIAGNOSIS — R2689 Other abnormalities of gait and mobility: Secondary | ICD-10-CM | POA: Diagnosis not present

## 2016-04-12 DIAGNOSIS — R278 Other lack of coordination: Secondary | ICD-10-CM | POA: Diagnosis not present

## 2016-04-12 DIAGNOSIS — M6281 Muscle weakness (generalized): Secondary | ICD-10-CM | POA: Diagnosis not present

## 2016-04-13 DIAGNOSIS — M6281 Muscle weakness (generalized): Secondary | ICD-10-CM | POA: Diagnosis not present

## 2016-04-13 DIAGNOSIS — R278 Other lack of coordination: Secondary | ICD-10-CM | POA: Diagnosis not present

## 2016-04-13 DIAGNOSIS — R2689 Other abnormalities of gait and mobility: Secondary | ICD-10-CM | POA: Diagnosis not present

## 2016-04-14 DIAGNOSIS — R2689 Other abnormalities of gait and mobility: Secondary | ICD-10-CM | POA: Diagnosis not present

## 2016-04-14 DIAGNOSIS — R278 Other lack of coordination: Secondary | ICD-10-CM | POA: Diagnosis not present

## 2016-04-14 DIAGNOSIS — M6281 Muscle weakness (generalized): Secondary | ICD-10-CM | POA: Diagnosis not present

## 2016-04-17 DIAGNOSIS — R2689 Other abnormalities of gait and mobility: Secondary | ICD-10-CM | POA: Diagnosis not present

## 2016-04-17 DIAGNOSIS — M6281 Muscle weakness (generalized): Secondary | ICD-10-CM | POA: Diagnosis not present

## 2016-04-17 DIAGNOSIS — R278 Other lack of coordination: Secondary | ICD-10-CM | POA: Diagnosis not present

## 2016-04-18 DIAGNOSIS — M6281 Muscle weakness (generalized): Secondary | ICD-10-CM | POA: Diagnosis not present

## 2016-04-18 DIAGNOSIS — R278 Other lack of coordination: Secondary | ICD-10-CM | POA: Diagnosis not present

## 2016-04-18 DIAGNOSIS — R2689 Other abnormalities of gait and mobility: Secondary | ICD-10-CM | POA: Diagnosis not present

## 2016-04-19 DIAGNOSIS — M6281 Muscle weakness (generalized): Secondary | ICD-10-CM | POA: Diagnosis not present

## 2016-04-19 DIAGNOSIS — R278 Other lack of coordination: Secondary | ICD-10-CM | POA: Diagnosis not present

## 2016-04-19 DIAGNOSIS — R2689 Other abnormalities of gait and mobility: Secondary | ICD-10-CM | POA: Diagnosis not present

## 2016-04-20 DIAGNOSIS — R2689 Other abnormalities of gait and mobility: Secondary | ICD-10-CM | POA: Diagnosis not present

## 2016-04-20 DIAGNOSIS — M6281 Muscle weakness (generalized): Secondary | ICD-10-CM | POA: Diagnosis not present

## 2016-04-20 DIAGNOSIS — R278 Other lack of coordination: Secondary | ICD-10-CM | POA: Diagnosis not present

## 2016-04-21 ENCOUNTER — Other Ambulatory Visit: Payer: Self-pay

## 2016-04-21 DIAGNOSIS — R278 Other lack of coordination: Secondary | ICD-10-CM | POA: Diagnosis not present

## 2016-04-21 DIAGNOSIS — R2689 Other abnormalities of gait and mobility: Secondary | ICD-10-CM | POA: Diagnosis not present

## 2016-04-21 DIAGNOSIS — M6281 Muscle weakness (generalized): Secondary | ICD-10-CM | POA: Diagnosis not present

## 2016-04-24 DIAGNOSIS — R278 Other lack of coordination: Secondary | ICD-10-CM | POA: Diagnosis not present

## 2016-04-24 DIAGNOSIS — R2689 Other abnormalities of gait and mobility: Secondary | ICD-10-CM | POA: Diagnosis not present

## 2016-04-24 DIAGNOSIS — M6281 Muscle weakness (generalized): Secondary | ICD-10-CM | POA: Diagnosis not present

## 2016-04-25 ENCOUNTER — Encounter: Payer: Self-pay | Admitting: Adult Health

## 2016-04-25 ENCOUNTER — Non-Acute Institutional Stay (SKILLED_NURSING_FACILITY): Payer: Medicare Other | Admitting: Adult Health

## 2016-04-25 DIAGNOSIS — R531 Weakness: Secondary | ICD-10-CM

## 2016-04-25 DIAGNOSIS — D649 Anemia, unspecified: Secondary | ICD-10-CM | POA: Diagnosis not present

## 2016-04-25 DIAGNOSIS — R278 Other lack of coordination: Secondary | ICD-10-CM | POA: Diagnosis not present

## 2016-04-25 DIAGNOSIS — D518 Other vitamin B12 deficiency anemias: Secondary | ICD-10-CM

## 2016-04-25 DIAGNOSIS — R609 Edema, unspecified: Secondary | ICD-10-CM

## 2016-04-25 DIAGNOSIS — R2689 Other abnormalities of gait and mobility: Secondary | ICD-10-CM | POA: Diagnosis not present

## 2016-04-25 DIAGNOSIS — K5901 Slow transit constipation: Secondary | ICD-10-CM

## 2016-04-25 DIAGNOSIS — E876 Hypokalemia: Secondary | ICD-10-CM | POA: Diagnosis not present

## 2016-04-25 DIAGNOSIS — M6281 Muscle weakness (generalized): Secondary | ICD-10-CM | POA: Diagnosis not present

## 2016-04-25 NOTE — Progress Notes (Signed)
Patient ID: Brooke Boyd, female   DOB: [birth year], 82 y.o.   MRN: [id number]     HPI:   This is a 82 year old femaleWho is for discharge to ALF with home health PT, OT, CNA and Nursing.  She has a  past medical history significant for HTN, left carotid stenosis, and anemia attributed to B12 deficiency. She has been admitted to [facility] on 03/24/16 from [facility] hospitalization 03/22/16 to 03/24/16. She was having generalized weakness and fall @ home. She stated that she fell in the kitchen by the refrigerator, but was able to drag herself into the living room where she was able to reach the phone.   He was thought to be slightly dehydrated and anemic due to severe vitamin b12 defciency anemia and is likely contributing to her weakness and, also, secondary to poor oral intake. She has been recommended to continue iron, folic acid and 123456 injections. Naproxen was discontinued which might cause gastritis and worsen anemia.  Patient was admitted to this facility for short-term rehabilitation after the patient's recent hospitalization.  Patient has completed SNF rehabilitation and therapy has cleared the patient for discharge.   Review of Systems GENERAL:  No fatigue, no weight changes, no fever or chills.   EYES:  Denies change in vision, dry eyes, eye pain, itching or discharge. EARS:  Wears bilateral hearing aids, but son has them currently.  No ringing in ears or earache. NOSE:  Denies nasal congestion or epistaxis. MOUTH AND THROAT:  Denies oral discomfort, gingival pain or bleeding, pain from teeth or hoarseness. RESPIRATORY:  No cough, SOB, DOE, wheezing, or hemoptysis. CARDIAC:  No chest pain or palpitations. GI: No abdominal pain, diarrhea, constipation, heartburn, nausea or vomiting.  States last BM was yesterday. GU:  Denies dysuria, frequency, hematuria, incontinence, or discharge.     Objective:   Physical Exam  GENERAL APPEARANCE:  Well-nourished, in no acute  distress. SKIN:  Skin is warm and dry. HEAD:  Normal in size and contour.  No evidence of trauma. EYES:  Lids open and close normally.  No blepharitis, entropion or ectropion.  PERRL.  Conjunctivae are clear, sclerae are white.  Lenses are without opacity. EARS:  Pinnae are normal.  Hard of hearing, has hearing aid on right ear, states she normally wears bilateral hearing aids but her son has them.   MOUTH AND THROAT:  Lips are without lesions.  Oral mucosa is moist and without lesions.  Tongue is normal in shape, size, and color, and without lesions. NECK:  Supple, trachea midline.  No neck masses, thyroid tenderness or thyromegaly. LYMPHATICS:  No LAD in the neck, no supraclavicular LAN. RESPIRATORY:  Breathing is even and unlabored.  BC CTAB. CARDIAC:  RRR, no murmurs or extra heart sounds. GI:  Abdomen soft, normal BS.  No masses, tenderness, hepatomegaly, or splenomegaly. EXTREMITIES:  Bilateral ankle swelling, 1+.  Able to move X4 extremities.   PSYCHIATRIC:  Alert to time and person, disoriented to place.       Labs reviewed: Basic Metabolic Panel:  Recent Labs  03/22/16 2013 03/23/16 0339 03/24/16 0328  NA 133* 136 136  K 4.1 4.0 3.7  CL 102 101 106  CO2 23 25 24   GLUCOSE 146* 122* 95  BUN 24* 21* 19  CREATININE 0.99 0.98 0.99  CALCIUM 8.4* 8.6* 8.1*   Liver Function Tests:  Recent Labs  03/13/16 1212  AST 15  ALT 8  ALKPHOS 87  BILITOT 0.5  PROT 6.6  ALBUMIN 3.5*   CBC:  Recent Labs  03/13/16 1212 03/22/16 2013 03/23/16 0848 03/24/16 0328  WBC 7.1 12.1* 8.1 7.6  NEUTROABS 3,692  --   --   --   HGB 9.6* 9.0* 8.9* 8.1*  HCT 29.1* 27.5* 27.4* 25.1*  MCV 93.0 92.3 92.9 92.3  PLT 242 289 288 283   Lipid Panel:  Recent Labs  03/13/16 1212  HDL 46   Cardiac Enzymes:  Recent Labs  03/24/16 0328  CKTOTAL 54   CBG:  Recent Labs  03/22/16 2159  GLUCAP 134*       ASSESSMENT/PLAN:  Generalized weakness - for Home health CNA, Nursing,  PT and OT, for therapeutic strengthening exercises; fall precaution  Normocytic Anemia -  Serum iron level 38 ; continue FeSO4 325 mg 1 tab PO Q D, Folic acid 1 mg 1 tab PO Q D and Vitamin B12 1,000 mcg injection Q  monthly; no CBC re-check result is available for review Lab Results  Component Value Date   HGB 8.1 (L) 03/24/2016   Vitamin B12 deficiency - continue Vitamin B12 1,000 mcg injection monthly  Constipation - continue Miralax 17 gm PO Q D PRN  Dependent edema - continue Lasix 20 mg 1 tab by mouth daily when necessary and KCL ER 20 meq 1 tab PO Q D when Lasix is taken     I have filled out patient's discharge paperwork and written prescriptions.  Patient will receive home health PT, OT, Nursing and CNA.  DME provided:  None  Total discharge time: Less than 30 minutes  Discharge time involved coordination of the discharge process with social worker, nursing staff and therapy department. Medical justification for home health services verified.    Monina C. [facility] [phone]

## 2016-04-26 DIAGNOSIS — R278 Other lack of coordination: Secondary | ICD-10-CM | POA: Diagnosis not present

## 2016-04-26 DIAGNOSIS — R2689 Other abnormalities of gait and mobility: Secondary | ICD-10-CM | POA: Diagnosis not present

## 2016-04-26 DIAGNOSIS — M6281 Muscle weakness (generalized): Secondary | ICD-10-CM | POA: Diagnosis not present

## 2016-05-05 DIAGNOSIS — E559 Vitamin D deficiency, unspecified: Secondary | ICD-10-CM | POA: Diagnosis not present

## 2016-05-05 DIAGNOSIS — H9190 Unspecified hearing loss, unspecified ear: Secondary | ICD-10-CM | POA: Diagnosis not present

## 2016-05-05 DIAGNOSIS — M1990 Unspecified osteoarthritis, unspecified site: Secondary | ICD-10-CM | POA: Diagnosis not present

## 2016-05-05 DIAGNOSIS — I10 Essential (primary) hypertension: Secondary | ICD-10-CM | POA: Diagnosis not present

## 2016-05-05 DIAGNOSIS — Z9181 History of falling: Secondary | ICD-10-CM | POA: Diagnosis not present

## 2016-05-05 DIAGNOSIS — D513 Other dietary vitamin B12 deficiency anemia: Secondary | ICD-10-CM | POA: Diagnosis not present

## 2016-05-05 DIAGNOSIS — L89602 Pressure ulcer of unspecified heel, stage 2: Secondary | ICD-10-CM | POA: Diagnosis not present

## 2016-05-05 DIAGNOSIS — R609 Edema, unspecified: Secondary | ICD-10-CM | POA: Diagnosis not present

## 2016-05-09 DIAGNOSIS — D509 Iron deficiency anemia, unspecified: Secondary | ICD-10-CM | POA: Diagnosis not present

## 2016-05-09 DIAGNOSIS — Z7982 Long term (current) use of aspirin: Secondary | ICD-10-CM | POA: Diagnosis not present

## 2016-05-09 DIAGNOSIS — I10 Essential (primary) hypertension: Secondary | ICD-10-CM | POA: Diagnosis not present

## 2016-05-09 DIAGNOSIS — M6281 Muscle weakness (generalized): Secondary | ICD-10-CM | POA: Diagnosis not present

## 2016-05-09 DIAGNOSIS — R2689 Other abnormalities of gait and mobility: Secondary | ICD-10-CM | POA: Diagnosis not present

## 2016-05-09 DIAGNOSIS — M1990 Unspecified osteoarthritis, unspecified site: Secondary | ICD-10-CM | POA: Diagnosis not present

## 2016-05-09 DIAGNOSIS — H9193 Unspecified hearing loss, bilateral: Secondary | ICD-10-CM | POA: Diagnosis not present

## 2016-05-09 DIAGNOSIS — F0390 Unspecified dementia without behavioral disturbance: Secondary | ICD-10-CM | POA: Diagnosis not present

## 2016-05-09 DIAGNOSIS — L89623 Pressure ulcer of left heel, stage 3: Secondary | ICD-10-CM | POA: Diagnosis not present

## 2016-05-09 DIAGNOSIS — I6522 Occlusion and stenosis of left carotid artery: Secondary | ICD-10-CM | POA: Diagnosis not present

## 2016-05-09 DIAGNOSIS — D513 Other dietary vitamin B12 deficiency anemia: Secondary | ICD-10-CM | POA: Diagnosis not present

## 2016-05-09 DIAGNOSIS — Z9181 History of falling: Secondary | ICD-10-CM | POA: Diagnosis not present

## 2016-05-10 DIAGNOSIS — D513 Other dietary vitamin B12 deficiency anemia: Secondary | ICD-10-CM | POA: Diagnosis not present

## 2016-05-10 DIAGNOSIS — R2689 Other abnormalities of gait and mobility: Secondary | ICD-10-CM | POA: Diagnosis not present

## 2016-05-10 DIAGNOSIS — I6522 Occlusion and stenosis of left carotid artery: Secondary | ICD-10-CM | POA: Diagnosis not present

## 2016-05-10 DIAGNOSIS — L89623 Pressure ulcer of left heel, stage 3: Secondary | ICD-10-CM | POA: Diagnosis not present

## 2016-05-10 DIAGNOSIS — M6281 Muscle weakness (generalized): Secondary | ICD-10-CM | POA: Diagnosis not present

## 2016-05-10 DIAGNOSIS — D509 Iron deficiency anemia, unspecified: Secondary | ICD-10-CM | POA: Diagnosis not present

## 2016-05-12 DIAGNOSIS — D509 Iron deficiency anemia, unspecified: Secondary | ICD-10-CM | POA: Diagnosis not present

## 2016-05-12 DIAGNOSIS — M6281 Muscle weakness (generalized): Secondary | ICD-10-CM | POA: Diagnosis not present

## 2016-05-12 DIAGNOSIS — D513 Other dietary vitamin B12 deficiency anemia: Secondary | ICD-10-CM | POA: Diagnosis not present

## 2016-05-12 DIAGNOSIS — R2689 Other abnormalities of gait and mobility: Secondary | ICD-10-CM | POA: Diagnosis not present

## 2016-05-12 DIAGNOSIS — L89623 Pressure ulcer of left heel, stage 3: Secondary | ICD-10-CM | POA: Diagnosis not present

## 2016-05-12 DIAGNOSIS — I6522 Occlusion and stenosis of left carotid artery: Secondary | ICD-10-CM | POA: Diagnosis not present

## 2016-05-13 DIAGNOSIS — M1990 Unspecified osteoarthritis, unspecified site: Secondary | ICD-10-CM | POA: Diagnosis not present

## 2016-05-13 DIAGNOSIS — E870 Hyperosmolality and hypernatremia: Secondary | ICD-10-CM | POA: Diagnosis not present

## 2016-05-13 DIAGNOSIS — D509 Iron deficiency anemia, unspecified: Secondary | ICD-10-CM | POA: Diagnosis not present

## 2016-05-13 DIAGNOSIS — D513 Other dietary vitamin B12 deficiency anemia: Secondary | ICD-10-CM | POA: Diagnosis not present

## 2016-05-13 DIAGNOSIS — I10 Essential (primary) hypertension: Secondary | ICD-10-CM | POA: Diagnosis not present

## 2016-05-13 DIAGNOSIS — H9190 Unspecified hearing loss, unspecified ear: Secondary | ICD-10-CM | POA: Diagnosis not present

## 2016-05-13 DIAGNOSIS — Z9181 History of falling: Secondary | ICD-10-CM | POA: Diagnosis not present

## 2016-05-16 DIAGNOSIS — I6522 Occlusion and stenosis of left carotid artery: Secondary | ICD-10-CM | POA: Diagnosis not present

## 2016-05-16 DIAGNOSIS — R2689 Other abnormalities of gait and mobility: Secondary | ICD-10-CM | POA: Diagnosis not present

## 2016-05-16 DIAGNOSIS — L89623 Pressure ulcer of left heel, stage 3: Secondary | ICD-10-CM | POA: Diagnosis not present

## 2016-05-16 DIAGNOSIS — D509 Iron deficiency anemia, unspecified: Secondary | ICD-10-CM | POA: Diagnosis not present

## 2016-05-16 DIAGNOSIS — M6281 Muscle weakness (generalized): Secondary | ICD-10-CM | POA: Diagnosis not present

## 2016-05-16 DIAGNOSIS — D513 Other dietary vitamin B12 deficiency anemia: Secondary | ICD-10-CM | POA: Diagnosis not present

## 2016-05-17 DIAGNOSIS — L89623 Pressure ulcer of left heel, stage 3: Secondary | ICD-10-CM | POA: Diagnosis not present

## 2016-05-17 DIAGNOSIS — D513 Other dietary vitamin B12 deficiency anemia: Secondary | ICD-10-CM | POA: Diagnosis not present

## 2016-05-17 DIAGNOSIS — D509 Iron deficiency anemia, unspecified: Secondary | ICD-10-CM | POA: Diagnosis not present

## 2016-05-17 DIAGNOSIS — M6281 Muscle weakness (generalized): Secondary | ICD-10-CM | POA: Diagnosis not present

## 2016-05-17 DIAGNOSIS — I6522 Occlusion and stenosis of left carotid artery: Secondary | ICD-10-CM | POA: Diagnosis not present

## 2016-05-17 DIAGNOSIS — R2689 Other abnormalities of gait and mobility: Secondary | ICD-10-CM | POA: Diagnosis not present

## 2016-05-18 DIAGNOSIS — D513 Other dietary vitamin B12 deficiency anemia: Secondary | ICD-10-CM | POA: Diagnosis not present

## 2016-05-18 DIAGNOSIS — M6281 Muscle weakness (generalized): Secondary | ICD-10-CM | POA: Diagnosis not present

## 2016-05-18 DIAGNOSIS — R2689 Other abnormalities of gait and mobility: Secondary | ICD-10-CM | POA: Diagnosis not present

## 2016-05-18 DIAGNOSIS — D509 Iron deficiency anemia, unspecified: Secondary | ICD-10-CM | POA: Diagnosis not present

## 2016-05-18 DIAGNOSIS — L89623 Pressure ulcer of left heel, stage 3: Secondary | ICD-10-CM | POA: Diagnosis not present

## 2016-05-18 DIAGNOSIS — I6522 Occlusion and stenosis of left carotid artery: Secondary | ICD-10-CM | POA: Diagnosis not present

## 2016-05-22 DIAGNOSIS — D513 Other dietary vitamin B12 deficiency anemia: Secondary | ICD-10-CM | POA: Diagnosis not present

## 2016-05-22 DIAGNOSIS — I6522 Occlusion and stenosis of left carotid artery: Secondary | ICD-10-CM | POA: Diagnosis not present

## 2016-05-22 DIAGNOSIS — R2689 Other abnormalities of gait and mobility: Secondary | ICD-10-CM | POA: Diagnosis not present

## 2016-05-22 DIAGNOSIS — D509 Iron deficiency anemia, unspecified: Secondary | ICD-10-CM | POA: Diagnosis not present

## 2016-05-22 DIAGNOSIS — L89623 Pressure ulcer of left heel, stage 3: Secondary | ICD-10-CM | POA: Diagnosis not present

## 2016-05-22 DIAGNOSIS — M6281 Muscle weakness (generalized): Secondary | ICD-10-CM | POA: Diagnosis not present

## 2016-05-24 DIAGNOSIS — I6522 Occlusion and stenosis of left carotid artery: Secondary | ICD-10-CM | POA: Diagnosis not present

## 2016-05-24 DIAGNOSIS — D509 Iron deficiency anemia, unspecified: Secondary | ICD-10-CM | POA: Diagnosis not present

## 2016-05-24 DIAGNOSIS — R2689 Other abnormalities of gait and mobility: Secondary | ICD-10-CM | POA: Diagnosis not present

## 2016-05-24 DIAGNOSIS — D513 Other dietary vitamin B12 deficiency anemia: Secondary | ICD-10-CM | POA: Diagnosis not present

## 2016-05-24 DIAGNOSIS — M6281 Muscle weakness (generalized): Secondary | ICD-10-CM | POA: Diagnosis not present

## 2016-05-24 DIAGNOSIS — L89623 Pressure ulcer of left heel, stage 3: Secondary | ICD-10-CM | POA: Diagnosis not present

## 2016-05-26 ENCOUNTER — Ambulatory Visit: Payer: PRIVATE HEALTH INSURANCE | Admitting: Internal Medicine

## 2016-05-26 DIAGNOSIS — L89623 Pressure ulcer of left heel, stage 3: Secondary | ICD-10-CM | POA: Diagnosis not present

## 2016-05-26 DIAGNOSIS — R2689 Other abnormalities of gait and mobility: Secondary | ICD-10-CM | POA: Diagnosis not present

## 2016-05-26 DIAGNOSIS — M6281 Muscle weakness (generalized): Secondary | ICD-10-CM | POA: Diagnosis not present

## 2016-05-26 DIAGNOSIS — I6522 Occlusion and stenosis of left carotid artery: Secondary | ICD-10-CM | POA: Diagnosis not present

## 2016-05-26 DIAGNOSIS — D509 Iron deficiency anemia, unspecified: Secondary | ICD-10-CM | POA: Diagnosis not present

## 2016-05-26 DIAGNOSIS — D513 Other dietary vitamin B12 deficiency anemia: Secondary | ICD-10-CM | POA: Diagnosis not present

## 2016-05-28 DIAGNOSIS — D513 Other dietary vitamin B12 deficiency anemia: Secondary | ICD-10-CM | POA: Diagnosis not present

## 2016-05-28 DIAGNOSIS — R2689 Other abnormalities of gait and mobility: Secondary | ICD-10-CM | POA: Diagnosis not present

## 2016-05-28 DIAGNOSIS — L89623 Pressure ulcer of left heel, stage 3: Secondary | ICD-10-CM | POA: Diagnosis not present

## 2016-05-28 DIAGNOSIS — I6522 Occlusion and stenosis of left carotid artery: Secondary | ICD-10-CM | POA: Diagnosis not present

## 2016-05-28 DIAGNOSIS — M6281 Muscle weakness (generalized): Secondary | ICD-10-CM | POA: Diagnosis not present

## 2016-05-28 DIAGNOSIS — D509 Iron deficiency anemia, unspecified: Secondary | ICD-10-CM | POA: Diagnosis not present

## 2016-05-29 DIAGNOSIS — M6281 Muscle weakness (generalized): Secondary | ICD-10-CM | POA: Diagnosis not present

## 2016-05-29 DIAGNOSIS — D509 Iron deficiency anemia, unspecified: Secondary | ICD-10-CM | POA: Diagnosis not present

## 2016-05-29 DIAGNOSIS — R2689 Other abnormalities of gait and mobility: Secondary | ICD-10-CM | POA: Diagnosis not present

## 2016-05-29 DIAGNOSIS — L89623 Pressure ulcer of left heel, stage 3: Secondary | ICD-10-CM | POA: Diagnosis not present

## 2016-05-29 DIAGNOSIS — I6522 Occlusion and stenosis of left carotid artery: Secondary | ICD-10-CM | POA: Diagnosis not present

## 2016-05-29 DIAGNOSIS — D513 Other dietary vitamin B12 deficiency anemia: Secondary | ICD-10-CM | POA: Diagnosis not present

## 2016-05-30 DIAGNOSIS — D513 Other dietary vitamin B12 deficiency anemia: Secondary | ICD-10-CM | POA: Diagnosis not present

## 2016-05-30 DIAGNOSIS — L89623 Pressure ulcer of left heel, stage 3: Secondary | ICD-10-CM | POA: Diagnosis not present

## 2016-05-30 DIAGNOSIS — R2689 Other abnormalities of gait and mobility: Secondary | ICD-10-CM | POA: Diagnosis not present

## 2016-05-30 DIAGNOSIS — M6281 Muscle weakness (generalized): Secondary | ICD-10-CM | POA: Diagnosis not present

## 2016-05-30 DIAGNOSIS — D509 Iron deficiency anemia, unspecified: Secondary | ICD-10-CM | POA: Diagnosis not present

## 2016-05-30 DIAGNOSIS — I6522 Occlusion and stenosis of left carotid artery: Secondary | ICD-10-CM | POA: Diagnosis not present

## 2016-05-31 DIAGNOSIS — M6281 Muscle weakness (generalized): Secondary | ICD-10-CM | POA: Diagnosis not present

## 2016-05-31 DIAGNOSIS — L89623 Pressure ulcer of left heel, stage 3: Secondary | ICD-10-CM | POA: Diagnosis not present

## 2016-05-31 DIAGNOSIS — D509 Iron deficiency anemia, unspecified: Secondary | ICD-10-CM | POA: Diagnosis not present

## 2016-05-31 DIAGNOSIS — I6522 Occlusion and stenosis of left carotid artery: Secondary | ICD-10-CM | POA: Diagnosis not present

## 2016-05-31 DIAGNOSIS — D513 Other dietary vitamin B12 deficiency anemia: Secondary | ICD-10-CM | POA: Diagnosis not present

## 2016-05-31 DIAGNOSIS — R2689 Other abnormalities of gait and mobility: Secondary | ICD-10-CM | POA: Diagnosis not present

## 2016-06-01 DIAGNOSIS — D513 Other dietary vitamin B12 deficiency anemia: Secondary | ICD-10-CM | POA: Diagnosis not present

## 2016-06-01 DIAGNOSIS — M6281 Muscle weakness (generalized): Secondary | ICD-10-CM | POA: Diagnosis not present

## 2016-06-01 DIAGNOSIS — D509 Iron deficiency anemia, unspecified: Secondary | ICD-10-CM | POA: Diagnosis not present

## 2016-06-01 DIAGNOSIS — L89623 Pressure ulcer of left heel, stage 3: Secondary | ICD-10-CM | POA: Diagnosis not present

## 2016-06-01 DIAGNOSIS — I6522 Occlusion and stenosis of left carotid artery: Secondary | ICD-10-CM | POA: Diagnosis not present

## 2016-06-01 DIAGNOSIS — R2689 Other abnormalities of gait and mobility: Secondary | ICD-10-CM | POA: Diagnosis not present

## 2016-06-02 DIAGNOSIS — M6281 Muscle weakness (generalized): Secondary | ICD-10-CM | POA: Diagnosis not present

## 2016-06-02 DIAGNOSIS — L89623 Pressure ulcer of left heel, stage 3: Secondary | ICD-10-CM | POA: Diagnosis not present

## 2016-06-02 DIAGNOSIS — R2689 Other abnormalities of gait and mobility: Secondary | ICD-10-CM | POA: Diagnosis not present

## 2016-06-02 DIAGNOSIS — I6522 Occlusion and stenosis of left carotid artery: Secondary | ICD-10-CM | POA: Diagnosis not present

## 2016-06-02 DIAGNOSIS — D509 Iron deficiency anemia, unspecified: Secondary | ICD-10-CM | POA: Diagnosis not present

## 2016-06-02 DIAGNOSIS — D513 Other dietary vitamin B12 deficiency anemia: Secondary | ICD-10-CM | POA: Diagnosis not present

## 2016-06-03 DIAGNOSIS — L89623 Pressure ulcer of left heel, stage 3: Secondary | ICD-10-CM | POA: Diagnosis not present

## 2016-06-03 DIAGNOSIS — D513 Other dietary vitamin B12 deficiency anemia: Secondary | ICD-10-CM | POA: Diagnosis not present

## 2016-06-03 DIAGNOSIS — D509 Iron deficiency anemia, unspecified: Secondary | ICD-10-CM | POA: Diagnosis not present

## 2016-06-03 DIAGNOSIS — R2689 Other abnormalities of gait and mobility: Secondary | ICD-10-CM | POA: Diagnosis not present

## 2016-06-03 DIAGNOSIS — M6281 Muscle weakness (generalized): Secondary | ICD-10-CM | POA: Diagnosis not present

## 2016-06-03 DIAGNOSIS — I6522 Occlusion and stenosis of left carotid artery: Secondary | ICD-10-CM | POA: Diagnosis not present

## 2016-06-05 DIAGNOSIS — D513 Other dietary vitamin B12 deficiency anemia: Secondary | ICD-10-CM | POA: Diagnosis not present

## 2016-06-05 DIAGNOSIS — R2689 Other abnormalities of gait and mobility: Secondary | ICD-10-CM | POA: Diagnosis not present

## 2016-06-05 DIAGNOSIS — L89623 Pressure ulcer of left heel, stage 3: Secondary | ICD-10-CM | POA: Diagnosis not present

## 2016-06-05 DIAGNOSIS — D509 Iron deficiency anemia, unspecified: Secondary | ICD-10-CM | POA: Diagnosis not present

## 2016-06-05 DIAGNOSIS — M6281 Muscle weakness (generalized): Secondary | ICD-10-CM | POA: Diagnosis not present

## 2016-06-05 DIAGNOSIS — I6522 Occlusion and stenosis of left carotid artery: Secondary | ICD-10-CM | POA: Diagnosis not present

## 2016-06-06 DIAGNOSIS — D509 Iron deficiency anemia, unspecified: Secondary | ICD-10-CM | POA: Diagnosis not present

## 2016-06-06 DIAGNOSIS — L89623 Pressure ulcer of left heel, stage 3: Secondary | ICD-10-CM | POA: Diagnosis not present

## 2016-06-06 DIAGNOSIS — I6522 Occlusion and stenosis of left carotid artery: Secondary | ICD-10-CM | POA: Diagnosis not present

## 2016-06-06 DIAGNOSIS — M6281 Muscle weakness (generalized): Secondary | ICD-10-CM | POA: Diagnosis not present

## 2016-06-06 DIAGNOSIS — D513 Other dietary vitamin B12 deficiency anemia: Secondary | ICD-10-CM | POA: Diagnosis not present

## 2016-06-06 DIAGNOSIS — R2689 Other abnormalities of gait and mobility: Secondary | ICD-10-CM | POA: Diagnosis not present

## 2016-06-07 DIAGNOSIS — L89623 Pressure ulcer of left heel, stage 3: Secondary | ICD-10-CM | POA: Diagnosis not present

## 2016-06-07 DIAGNOSIS — D509 Iron deficiency anemia, unspecified: Secondary | ICD-10-CM | POA: Diagnosis not present

## 2016-06-07 DIAGNOSIS — I6522 Occlusion and stenosis of left carotid artery: Secondary | ICD-10-CM | POA: Diagnosis not present

## 2016-06-07 DIAGNOSIS — D513 Other dietary vitamin B12 deficiency anemia: Secondary | ICD-10-CM | POA: Diagnosis not present

## 2016-06-07 DIAGNOSIS — R2689 Other abnormalities of gait and mobility: Secondary | ICD-10-CM | POA: Diagnosis not present

## 2016-06-07 DIAGNOSIS — M6281 Muscle weakness (generalized): Secondary | ICD-10-CM | POA: Diagnosis not present

## 2016-06-08 DIAGNOSIS — M6281 Muscle weakness (generalized): Secondary | ICD-10-CM | POA: Diagnosis not present

## 2016-06-08 DIAGNOSIS — I6522 Occlusion and stenosis of left carotid artery: Secondary | ICD-10-CM | POA: Diagnosis not present

## 2016-06-08 DIAGNOSIS — R2689 Other abnormalities of gait and mobility: Secondary | ICD-10-CM | POA: Diagnosis not present

## 2016-06-08 DIAGNOSIS — D513 Other dietary vitamin B12 deficiency anemia: Secondary | ICD-10-CM | POA: Diagnosis not present

## 2016-06-08 DIAGNOSIS — D509 Iron deficiency anemia, unspecified: Secondary | ICD-10-CM | POA: Diagnosis not present

## 2016-06-08 DIAGNOSIS — L89623 Pressure ulcer of left heel, stage 3: Secondary | ICD-10-CM | POA: Diagnosis not present

## 2016-06-09 DIAGNOSIS — E559 Vitamin D deficiency, unspecified: Secondary | ICD-10-CM | POA: Diagnosis not present

## 2016-06-09 DIAGNOSIS — D513 Other dietary vitamin B12 deficiency anemia: Secondary | ICD-10-CM | POA: Diagnosis not present

## 2016-06-09 DIAGNOSIS — I10 Essential (primary) hypertension: Secondary | ICD-10-CM | POA: Diagnosis not present

## 2016-06-09 DIAGNOSIS — H9190 Unspecified hearing loss, unspecified ear: Secondary | ICD-10-CM | POA: Diagnosis not present

## 2016-06-09 DIAGNOSIS — R2689 Other abnormalities of gait and mobility: Secondary | ICD-10-CM | POA: Diagnosis not present

## 2016-06-09 DIAGNOSIS — R609 Edema, unspecified: Secondary | ICD-10-CM | POA: Diagnosis not present

## 2016-06-09 DIAGNOSIS — L89602 Pressure ulcer of unspecified heel, stage 2: Secondary | ICD-10-CM | POA: Diagnosis not present

## 2016-06-09 DIAGNOSIS — I6522 Occlusion and stenosis of left carotid artery: Secondary | ICD-10-CM | POA: Diagnosis not present

## 2016-06-09 DIAGNOSIS — L89623 Pressure ulcer of left heel, stage 3: Secondary | ICD-10-CM | POA: Diagnosis not present

## 2016-06-09 DIAGNOSIS — D509 Iron deficiency anemia, unspecified: Secondary | ICD-10-CM | POA: Diagnosis not present

## 2016-06-09 DIAGNOSIS — R296 Repeated falls: Secondary | ICD-10-CM | POA: Diagnosis not present

## 2016-06-09 DIAGNOSIS — M1990 Unspecified osteoarthritis, unspecified site: Secondary | ICD-10-CM | POA: Diagnosis not present

## 2016-06-09 DIAGNOSIS — M6281 Muscle weakness (generalized): Secondary | ICD-10-CM | POA: Diagnosis not present

## 2016-06-12 DIAGNOSIS — Z9181 History of falling: Secondary | ICD-10-CM | POA: Diagnosis not present

## 2016-06-12 DIAGNOSIS — D513 Other dietary vitamin B12 deficiency anemia: Secondary | ICD-10-CM | POA: Diagnosis not present

## 2016-06-12 DIAGNOSIS — I10 Essential (primary) hypertension: Secondary | ICD-10-CM | POA: Diagnosis not present

## 2016-06-12 DIAGNOSIS — I6522 Occlusion and stenosis of left carotid artery: Secondary | ICD-10-CM | POA: Diagnosis not present

## 2016-06-12 DIAGNOSIS — H9190 Unspecified hearing loss, unspecified ear: Secondary | ICD-10-CM | POA: Diagnosis not present

## 2016-06-12 DIAGNOSIS — R2689 Other abnormalities of gait and mobility: Secondary | ICD-10-CM | POA: Diagnosis not present

## 2016-06-12 DIAGNOSIS — E559 Vitamin D deficiency, unspecified: Secondary | ICD-10-CM | POA: Diagnosis not present

## 2016-06-12 DIAGNOSIS — M1990 Unspecified osteoarthritis, unspecified site: Secondary | ICD-10-CM | POA: Diagnosis not present

## 2016-06-12 DIAGNOSIS — D509 Iron deficiency anemia, unspecified: Secondary | ICD-10-CM | POA: Diagnosis not present

## 2016-06-12 DIAGNOSIS — M6281 Muscle weakness (generalized): Secondary | ICD-10-CM | POA: Diagnosis not present

## 2016-06-12 DIAGNOSIS — E870 Hyperosmolality and hypernatremia: Secondary | ICD-10-CM | POA: Diagnosis not present

## 2016-06-12 DIAGNOSIS — L89623 Pressure ulcer of left heel, stage 3: Secondary | ICD-10-CM | POA: Diagnosis not present

## 2016-06-13 DIAGNOSIS — M6281 Muscle weakness (generalized): Secondary | ICD-10-CM | POA: Diagnosis not present

## 2016-06-13 DIAGNOSIS — I6522 Occlusion and stenosis of left carotid artery: Secondary | ICD-10-CM | POA: Diagnosis not present

## 2016-06-13 DIAGNOSIS — R2689 Other abnormalities of gait and mobility: Secondary | ICD-10-CM | POA: Diagnosis not present

## 2016-06-13 DIAGNOSIS — L89623 Pressure ulcer of left heel, stage 3: Secondary | ICD-10-CM | POA: Diagnosis not present

## 2016-06-13 DIAGNOSIS — D509 Iron deficiency anemia, unspecified: Secondary | ICD-10-CM | POA: Diagnosis not present

## 2016-06-13 DIAGNOSIS — D513 Other dietary vitamin B12 deficiency anemia: Secondary | ICD-10-CM | POA: Diagnosis not present

## 2016-06-14 DIAGNOSIS — M6281 Muscle weakness (generalized): Secondary | ICD-10-CM | POA: Diagnosis not present

## 2016-06-14 DIAGNOSIS — L89623 Pressure ulcer of left heel, stage 3: Secondary | ICD-10-CM | POA: Diagnosis not present

## 2016-06-14 DIAGNOSIS — D513 Other dietary vitamin B12 deficiency anemia: Secondary | ICD-10-CM | POA: Diagnosis not present

## 2016-06-14 DIAGNOSIS — R2689 Other abnormalities of gait and mobility: Secondary | ICD-10-CM | POA: Diagnosis not present

## 2016-06-14 DIAGNOSIS — I6522 Occlusion and stenosis of left carotid artery: Secondary | ICD-10-CM | POA: Diagnosis not present

## 2016-06-14 DIAGNOSIS — D509 Iron deficiency anemia, unspecified: Secondary | ICD-10-CM | POA: Diagnosis not present

## 2016-06-15 DIAGNOSIS — D509 Iron deficiency anemia, unspecified: Secondary | ICD-10-CM | POA: Diagnosis not present

## 2016-06-15 DIAGNOSIS — D513 Other dietary vitamin B12 deficiency anemia: Secondary | ICD-10-CM | POA: Diagnosis not present

## 2016-06-15 DIAGNOSIS — R2689 Other abnormalities of gait and mobility: Secondary | ICD-10-CM | POA: Diagnosis not present

## 2016-06-15 DIAGNOSIS — I6522 Occlusion and stenosis of left carotid artery: Secondary | ICD-10-CM | POA: Diagnosis not present

## 2016-06-15 DIAGNOSIS — M6281 Muscle weakness (generalized): Secondary | ICD-10-CM | POA: Diagnosis not present

## 2016-06-15 DIAGNOSIS — L89623 Pressure ulcer of left heel, stage 3: Secondary | ICD-10-CM | POA: Diagnosis not present

## 2016-06-16 DIAGNOSIS — R2689 Other abnormalities of gait and mobility: Secondary | ICD-10-CM | POA: Diagnosis not present

## 2016-06-16 DIAGNOSIS — D509 Iron deficiency anemia, unspecified: Secondary | ICD-10-CM | POA: Diagnosis not present

## 2016-06-16 DIAGNOSIS — L89623 Pressure ulcer of left heel, stage 3: Secondary | ICD-10-CM | POA: Diagnosis not present

## 2016-06-16 DIAGNOSIS — M6281 Muscle weakness (generalized): Secondary | ICD-10-CM | POA: Diagnosis not present

## 2016-06-16 DIAGNOSIS — D513 Other dietary vitamin B12 deficiency anemia: Secondary | ICD-10-CM | POA: Diagnosis not present

## 2016-06-16 DIAGNOSIS — I6522 Occlusion and stenosis of left carotid artery: Secondary | ICD-10-CM | POA: Diagnosis not present

## 2016-06-19 DIAGNOSIS — M6281 Muscle weakness (generalized): Secondary | ICD-10-CM | POA: Diagnosis not present

## 2016-06-19 DIAGNOSIS — R2689 Other abnormalities of gait and mobility: Secondary | ICD-10-CM | POA: Diagnosis not present

## 2016-06-19 DIAGNOSIS — I6522 Occlusion and stenosis of left carotid artery: Secondary | ICD-10-CM | POA: Diagnosis not present

## 2016-06-19 DIAGNOSIS — D509 Iron deficiency anemia, unspecified: Secondary | ICD-10-CM | POA: Diagnosis not present

## 2016-06-19 DIAGNOSIS — D513 Other dietary vitamin B12 deficiency anemia: Secondary | ICD-10-CM | POA: Diagnosis not present

## 2016-06-19 DIAGNOSIS — L89623 Pressure ulcer of left heel, stage 3: Secondary | ICD-10-CM | POA: Diagnosis not present

## 2016-06-21 DIAGNOSIS — R2689 Other abnormalities of gait and mobility: Secondary | ICD-10-CM | POA: Diagnosis not present

## 2016-06-21 DIAGNOSIS — D509 Iron deficiency anemia, unspecified: Secondary | ICD-10-CM | POA: Diagnosis not present

## 2016-06-21 DIAGNOSIS — M6281 Muscle weakness (generalized): Secondary | ICD-10-CM | POA: Diagnosis not present

## 2016-06-21 DIAGNOSIS — I10 Essential (primary) hypertension: Secondary | ICD-10-CM | POA: Diagnosis not present

## 2016-06-21 DIAGNOSIS — E870 Hyperosmolality and hypernatremia: Secondary | ICD-10-CM | POA: Diagnosis not present

## 2016-06-21 DIAGNOSIS — D513 Other dietary vitamin B12 deficiency anemia: Secondary | ICD-10-CM | POA: Diagnosis not present

## 2016-06-21 DIAGNOSIS — E559 Vitamin D deficiency, unspecified: Secondary | ICD-10-CM | POA: Diagnosis not present

## 2016-06-21 DIAGNOSIS — I6522 Occlusion and stenosis of left carotid artery: Secondary | ICD-10-CM | POA: Diagnosis not present

## 2016-06-21 DIAGNOSIS — L89623 Pressure ulcer of left heel, stage 3: Secondary | ICD-10-CM | POA: Diagnosis not present

## 2016-06-23 DIAGNOSIS — R2689 Other abnormalities of gait and mobility: Secondary | ICD-10-CM | POA: Diagnosis not present

## 2016-06-23 DIAGNOSIS — D509 Iron deficiency anemia, unspecified: Secondary | ICD-10-CM | POA: Diagnosis not present

## 2016-06-23 DIAGNOSIS — M6281 Muscle weakness (generalized): Secondary | ICD-10-CM | POA: Diagnosis not present

## 2016-06-23 DIAGNOSIS — D513 Other dietary vitamin B12 deficiency anemia: Secondary | ICD-10-CM | POA: Diagnosis not present

## 2016-06-23 DIAGNOSIS — L89623 Pressure ulcer of left heel, stage 3: Secondary | ICD-10-CM | POA: Diagnosis not present

## 2016-06-23 DIAGNOSIS — I6522 Occlusion and stenosis of left carotid artery: Secondary | ICD-10-CM | POA: Diagnosis not present

## 2016-06-26 DIAGNOSIS — I6522 Occlusion and stenosis of left carotid artery: Secondary | ICD-10-CM | POA: Diagnosis not present

## 2016-06-26 DIAGNOSIS — D509 Iron deficiency anemia, unspecified: Secondary | ICD-10-CM | POA: Diagnosis not present

## 2016-06-26 DIAGNOSIS — D513 Other dietary vitamin B12 deficiency anemia: Secondary | ICD-10-CM | POA: Diagnosis not present

## 2016-06-26 DIAGNOSIS — R2689 Other abnormalities of gait and mobility: Secondary | ICD-10-CM | POA: Diagnosis not present

## 2016-06-26 DIAGNOSIS — M6281 Muscle weakness (generalized): Secondary | ICD-10-CM | POA: Diagnosis not present

## 2016-06-26 DIAGNOSIS — L89623 Pressure ulcer of left heel, stage 3: Secondary | ICD-10-CM | POA: Diagnosis not present

## 2016-06-27 ENCOUNTER — Encounter (HOSPITAL_BASED_OUTPATIENT_CLINIC_OR_DEPARTMENT_OTHER): Payer: Medicare Other | Attending: Surgery

## 2016-06-27 DIAGNOSIS — Z79899 Other long term (current) drug therapy: Secondary | ICD-10-CM | POA: Insufficient documentation

## 2016-06-27 DIAGNOSIS — L89623 Pressure ulcer of left heel, stage 3: Secondary | ICD-10-CM | POA: Insufficient documentation

## 2016-06-27 DIAGNOSIS — I890 Lymphedema, not elsewhere classified: Secondary | ICD-10-CM | POA: Diagnosis not present

## 2016-06-27 DIAGNOSIS — Z87891 Personal history of nicotine dependence: Secondary | ICD-10-CM | POA: Diagnosis not present

## 2016-06-27 DIAGNOSIS — I10 Essential (primary) hypertension: Secondary | ICD-10-CM | POA: Diagnosis not present

## 2016-06-27 DIAGNOSIS — Z7982 Long term (current) use of aspirin: Secondary | ICD-10-CM | POA: Insufficient documentation

## 2016-06-28 DIAGNOSIS — L89623 Pressure ulcer of left heel, stage 3: Secondary | ICD-10-CM | POA: Diagnosis not present

## 2016-06-28 DIAGNOSIS — D509 Iron deficiency anemia, unspecified: Secondary | ICD-10-CM | POA: Diagnosis not present

## 2016-06-28 DIAGNOSIS — R2689 Other abnormalities of gait and mobility: Secondary | ICD-10-CM | POA: Diagnosis not present

## 2016-06-28 DIAGNOSIS — M6281 Muscle weakness (generalized): Secondary | ICD-10-CM | POA: Diagnosis not present

## 2016-06-28 DIAGNOSIS — I6522 Occlusion and stenosis of left carotid artery: Secondary | ICD-10-CM | POA: Diagnosis not present

## 2016-06-28 DIAGNOSIS — D513 Other dietary vitamin B12 deficiency anemia: Secondary | ICD-10-CM | POA: Diagnosis not present

## 2016-06-30 DIAGNOSIS — R2689 Other abnormalities of gait and mobility: Secondary | ICD-10-CM | POA: Diagnosis not present

## 2016-06-30 DIAGNOSIS — D513 Other dietary vitamin B12 deficiency anemia: Secondary | ICD-10-CM | POA: Diagnosis not present

## 2016-06-30 DIAGNOSIS — L89623 Pressure ulcer of left heel, stage 3: Secondary | ICD-10-CM | POA: Diagnosis not present

## 2016-06-30 DIAGNOSIS — M6281 Muscle weakness (generalized): Secondary | ICD-10-CM | POA: Diagnosis not present

## 2016-06-30 DIAGNOSIS — I6522 Occlusion and stenosis of left carotid artery: Secondary | ICD-10-CM | POA: Diagnosis not present

## 2016-06-30 DIAGNOSIS — D509 Iron deficiency anemia, unspecified: Secondary | ICD-10-CM | POA: Diagnosis not present

## 2016-07-02 DIAGNOSIS — L89623 Pressure ulcer of left heel, stage 3: Secondary | ICD-10-CM | POA: Diagnosis not present

## 2016-07-02 DIAGNOSIS — D513 Other dietary vitamin B12 deficiency anemia: Secondary | ICD-10-CM | POA: Diagnosis not present

## 2016-07-02 DIAGNOSIS — R2689 Other abnormalities of gait and mobility: Secondary | ICD-10-CM | POA: Diagnosis not present

## 2016-07-02 DIAGNOSIS — M6281 Muscle weakness (generalized): Secondary | ICD-10-CM | POA: Diagnosis not present

## 2016-07-02 DIAGNOSIS — I6522 Occlusion and stenosis of left carotid artery: Secondary | ICD-10-CM | POA: Diagnosis not present

## 2016-07-02 DIAGNOSIS — D509 Iron deficiency anemia, unspecified: Secondary | ICD-10-CM | POA: Diagnosis not present

## 2016-07-05 DIAGNOSIS — M6281 Muscle weakness (generalized): Secondary | ICD-10-CM | POA: Diagnosis not present

## 2016-07-05 DIAGNOSIS — I6522 Occlusion and stenosis of left carotid artery: Secondary | ICD-10-CM | POA: Diagnosis not present

## 2016-07-05 DIAGNOSIS — L89623 Pressure ulcer of left heel, stage 3: Secondary | ICD-10-CM | POA: Diagnosis not present

## 2016-07-05 DIAGNOSIS — R2689 Other abnormalities of gait and mobility: Secondary | ICD-10-CM | POA: Diagnosis not present

## 2016-07-05 DIAGNOSIS — D509 Iron deficiency anemia, unspecified: Secondary | ICD-10-CM | POA: Diagnosis not present

## 2016-07-05 DIAGNOSIS — D513 Other dietary vitamin B12 deficiency anemia: Secondary | ICD-10-CM | POA: Diagnosis not present

## 2016-07-07 DIAGNOSIS — D513 Other dietary vitamin B12 deficiency anemia: Secondary | ICD-10-CM | POA: Diagnosis not present

## 2016-07-07 DIAGNOSIS — M1990 Unspecified osteoarthritis, unspecified site: Secondary | ICD-10-CM | POA: Diagnosis not present

## 2016-07-07 DIAGNOSIS — R609 Edema, unspecified: Secondary | ICD-10-CM | POA: Diagnosis not present

## 2016-07-07 DIAGNOSIS — E559 Vitamin D deficiency, unspecified: Secondary | ICD-10-CM | POA: Diagnosis not present

## 2016-07-07 DIAGNOSIS — D509 Iron deficiency anemia, unspecified: Secondary | ICD-10-CM | POA: Diagnosis not present

## 2016-07-07 DIAGNOSIS — L89623 Pressure ulcer of left heel, stage 3: Secondary | ICD-10-CM | POA: Diagnosis not present

## 2016-07-07 DIAGNOSIS — M6281 Muscle weakness (generalized): Secondary | ICD-10-CM | POA: Diagnosis not present

## 2016-07-07 DIAGNOSIS — I6522 Occlusion and stenosis of left carotid artery: Secondary | ICD-10-CM | POA: Diagnosis not present

## 2016-07-07 DIAGNOSIS — R296 Repeated falls: Secondary | ICD-10-CM | POA: Diagnosis not present

## 2016-07-07 DIAGNOSIS — R2689 Other abnormalities of gait and mobility: Secondary | ICD-10-CM | POA: Diagnosis not present

## 2016-07-07 DIAGNOSIS — H9190 Unspecified hearing loss, unspecified ear: Secondary | ICD-10-CM | POA: Diagnosis not present

## 2016-07-07 DIAGNOSIS — L89602 Pressure ulcer of unspecified heel, stage 2: Secondary | ICD-10-CM | POA: Diagnosis not present

## 2016-07-07 DIAGNOSIS — I10 Essential (primary) hypertension: Secondary | ICD-10-CM | POA: Diagnosis not present

## 2016-07-08 DIAGNOSIS — F0390 Unspecified dementia without behavioral disturbance: Secondary | ICD-10-CM | POA: Diagnosis not present

## 2016-07-08 DIAGNOSIS — D513 Other dietary vitamin B12 deficiency anemia: Secondary | ICD-10-CM | POA: Diagnosis not present

## 2016-07-08 DIAGNOSIS — I10 Essential (primary) hypertension: Secondary | ICD-10-CM | POA: Diagnosis not present

## 2016-07-08 DIAGNOSIS — H9193 Unspecified hearing loss, bilateral: Secondary | ICD-10-CM | POA: Diagnosis not present

## 2016-07-08 DIAGNOSIS — M1990 Unspecified osteoarthritis, unspecified site: Secondary | ICD-10-CM | POA: Diagnosis not present

## 2016-07-08 DIAGNOSIS — L89623 Pressure ulcer of left heel, stage 3: Secondary | ICD-10-CM | POA: Diagnosis not present

## 2016-07-08 DIAGNOSIS — Z9181 History of falling: Secondary | ICD-10-CM | POA: Diagnosis not present

## 2016-07-08 DIAGNOSIS — Z4800 Encounter for change or removal of nonsurgical wound dressing: Secondary | ICD-10-CM | POA: Diagnosis not present

## 2016-07-08 DIAGNOSIS — D509 Iron deficiency anemia, unspecified: Secondary | ICD-10-CM | POA: Diagnosis not present

## 2016-07-08 DIAGNOSIS — Z7982 Long term (current) use of aspirin: Secondary | ICD-10-CM | POA: Diagnosis not present

## 2016-07-08 DIAGNOSIS — I6522 Occlusion and stenosis of left carotid artery: Secondary | ICD-10-CM | POA: Diagnosis not present

## 2016-07-10 DIAGNOSIS — Z9181 History of falling: Secondary | ICD-10-CM | POA: Diagnosis not present

## 2016-07-10 DIAGNOSIS — M1990 Unspecified osteoarthritis, unspecified site: Secondary | ICD-10-CM | POA: Diagnosis not present

## 2016-07-10 DIAGNOSIS — F0390 Unspecified dementia without behavioral disturbance: Secondary | ICD-10-CM | POA: Diagnosis not present

## 2016-07-10 DIAGNOSIS — L89623 Pressure ulcer of left heel, stage 3: Secondary | ICD-10-CM | POA: Diagnosis not present

## 2016-07-10 DIAGNOSIS — I10 Essential (primary) hypertension: Secondary | ICD-10-CM | POA: Diagnosis not present

## 2016-07-10 DIAGNOSIS — H9190 Unspecified hearing loss, unspecified ear: Secondary | ICD-10-CM | POA: Diagnosis not present

## 2016-07-10 DIAGNOSIS — E870 Hyperosmolality and hypernatremia: Secondary | ICD-10-CM | POA: Diagnosis not present

## 2016-07-10 DIAGNOSIS — I6522 Occlusion and stenosis of left carotid artery: Secondary | ICD-10-CM | POA: Diagnosis not present

## 2016-07-10 DIAGNOSIS — D513 Other dietary vitamin B12 deficiency anemia: Secondary | ICD-10-CM | POA: Diagnosis not present

## 2016-07-10 DIAGNOSIS — E559 Vitamin D deficiency, unspecified: Secondary | ICD-10-CM | POA: Diagnosis not present

## 2016-07-10 DIAGNOSIS — D509 Iron deficiency anemia, unspecified: Secondary | ICD-10-CM | POA: Diagnosis not present

## 2016-07-12 DIAGNOSIS — D513 Other dietary vitamin B12 deficiency anemia: Secondary | ICD-10-CM | POA: Diagnosis not present

## 2016-07-12 DIAGNOSIS — D509 Iron deficiency anemia, unspecified: Secondary | ICD-10-CM | POA: Diagnosis not present

## 2016-07-12 DIAGNOSIS — I6522 Occlusion and stenosis of left carotid artery: Secondary | ICD-10-CM | POA: Diagnosis not present

## 2016-07-12 DIAGNOSIS — I10 Essential (primary) hypertension: Secondary | ICD-10-CM | POA: Diagnosis not present

## 2016-07-12 DIAGNOSIS — L89623 Pressure ulcer of left heel, stage 3: Secondary | ICD-10-CM | POA: Diagnosis not present

## 2016-07-12 DIAGNOSIS — F0390 Unspecified dementia without behavioral disturbance: Secondary | ICD-10-CM | POA: Diagnosis not present

## 2016-07-14 DIAGNOSIS — D513 Other dietary vitamin B12 deficiency anemia: Secondary | ICD-10-CM | POA: Diagnosis not present

## 2016-07-14 DIAGNOSIS — F0390 Unspecified dementia without behavioral disturbance: Secondary | ICD-10-CM | POA: Diagnosis not present

## 2016-07-14 DIAGNOSIS — I6522 Occlusion and stenosis of left carotid artery: Secondary | ICD-10-CM | POA: Diagnosis not present

## 2016-07-14 DIAGNOSIS — I10 Essential (primary) hypertension: Secondary | ICD-10-CM | POA: Diagnosis not present

## 2016-07-14 DIAGNOSIS — L89623 Pressure ulcer of left heel, stage 3: Secondary | ICD-10-CM | POA: Diagnosis not present

## 2016-07-14 DIAGNOSIS — D509 Iron deficiency anemia, unspecified: Secondary | ICD-10-CM | POA: Diagnosis not present

## 2016-07-17 DIAGNOSIS — I6522 Occlusion and stenosis of left carotid artery: Secondary | ICD-10-CM | POA: Diagnosis not present

## 2016-07-17 DIAGNOSIS — F0390 Unspecified dementia without behavioral disturbance: Secondary | ICD-10-CM | POA: Diagnosis not present

## 2016-07-17 DIAGNOSIS — I10 Essential (primary) hypertension: Secondary | ICD-10-CM | POA: Diagnosis not present

## 2016-07-17 DIAGNOSIS — L89623 Pressure ulcer of left heel, stage 3: Secondary | ICD-10-CM | POA: Diagnosis not present

## 2016-07-17 DIAGNOSIS — D509 Iron deficiency anemia, unspecified: Secondary | ICD-10-CM | POA: Diagnosis not present

## 2016-07-17 DIAGNOSIS — D513 Other dietary vitamin B12 deficiency anemia: Secondary | ICD-10-CM | POA: Diagnosis not present

## 2016-07-19 DIAGNOSIS — D513 Other dietary vitamin B12 deficiency anemia: Secondary | ICD-10-CM | POA: Diagnosis not present

## 2016-07-19 DIAGNOSIS — I10 Essential (primary) hypertension: Secondary | ICD-10-CM | POA: Diagnosis not present

## 2016-07-19 DIAGNOSIS — D509 Iron deficiency anemia, unspecified: Secondary | ICD-10-CM | POA: Diagnosis not present

## 2016-07-19 DIAGNOSIS — F0390 Unspecified dementia without behavioral disturbance: Secondary | ICD-10-CM | POA: Diagnosis not present

## 2016-07-19 DIAGNOSIS — L89623 Pressure ulcer of left heel, stage 3: Secondary | ICD-10-CM | POA: Diagnosis not present

## 2016-07-19 DIAGNOSIS — I6522 Occlusion and stenosis of left carotid artery: Secondary | ICD-10-CM | POA: Diagnosis not present

## 2016-07-22 DIAGNOSIS — I10 Essential (primary) hypertension: Secondary | ICD-10-CM | POA: Diagnosis not present

## 2016-07-22 DIAGNOSIS — D513 Other dietary vitamin B12 deficiency anemia: Secondary | ICD-10-CM | POA: Diagnosis not present

## 2016-07-22 DIAGNOSIS — I6522 Occlusion and stenosis of left carotid artery: Secondary | ICD-10-CM | POA: Diagnosis not present

## 2016-07-22 DIAGNOSIS — F0390 Unspecified dementia without behavioral disturbance: Secondary | ICD-10-CM | POA: Diagnosis not present

## 2016-07-22 DIAGNOSIS — D509 Iron deficiency anemia, unspecified: Secondary | ICD-10-CM | POA: Diagnosis not present

## 2016-07-22 DIAGNOSIS — L89623 Pressure ulcer of left heel, stage 3: Secondary | ICD-10-CM | POA: Diagnosis not present

## 2016-07-23 DIAGNOSIS — I6522 Occlusion and stenosis of left carotid artery: Secondary | ICD-10-CM | POA: Diagnosis not present

## 2016-07-23 DIAGNOSIS — D513 Other dietary vitamin B12 deficiency anemia: Secondary | ICD-10-CM | POA: Diagnosis not present

## 2016-07-23 DIAGNOSIS — L89623 Pressure ulcer of left heel, stage 3: Secondary | ICD-10-CM | POA: Diagnosis not present

## 2016-07-23 DIAGNOSIS — D509 Iron deficiency anemia, unspecified: Secondary | ICD-10-CM | POA: Diagnosis not present

## 2016-07-23 DIAGNOSIS — F0390 Unspecified dementia without behavioral disturbance: Secondary | ICD-10-CM | POA: Diagnosis not present

## 2016-07-23 DIAGNOSIS — I10 Essential (primary) hypertension: Secondary | ICD-10-CM | POA: Diagnosis not present

## 2016-07-26 DIAGNOSIS — I10 Essential (primary) hypertension: Secondary | ICD-10-CM | POA: Diagnosis not present

## 2016-07-26 DIAGNOSIS — D509 Iron deficiency anemia, unspecified: Secondary | ICD-10-CM | POA: Diagnosis not present

## 2016-07-26 DIAGNOSIS — I6522 Occlusion and stenosis of left carotid artery: Secondary | ICD-10-CM | POA: Diagnosis not present

## 2016-07-26 DIAGNOSIS — F0390 Unspecified dementia without behavioral disturbance: Secondary | ICD-10-CM | POA: Diagnosis not present

## 2016-07-26 DIAGNOSIS — D513 Other dietary vitamin B12 deficiency anemia: Secondary | ICD-10-CM | POA: Diagnosis not present

## 2016-07-26 DIAGNOSIS — L89623 Pressure ulcer of left heel, stage 3: Secondary | ICD-10-CM | POA: Diagnosis not present

## 2016-07-28 DIAGNOSIS — I10 Essential (primary) hypertension: Secondary | ICD-10-CM | POA: Diagnosis not present

## 2016-07-28 DIAGNOSIS — D513 Other dietary vitamin B12 deficiency anemia: Secondary | ICD-10-CM | POA: Diagnosis not present

## 2016-07-28 DIAGNOSIS — R609 Edema, unspecified: Secondary | ICD-10-CM | POA: Diagnosis not present

## 2016-07-28 DIAGNOSIS — E871 Hypo-osmolality and hyponatremia: Secondary | ICD-10-CM | POA: Diagnosis not present

## 2016-07-28 DIAGNOSIS — L89602 Pressure ulcer of unspecified heel, stage 2: Secondary | ICD-10-CM | POA: Diagnosis not present

## 2016-07-28 DIAGNOSIS — M1990 Unspecified osteoarthritis, unspecified site: Secondary | ICD-10-CM | POA: Diagnosis not present

## 2016-07-28 DIAGNOSIS — D509 Iron deficiency anemia, unspecified: Secondary | ICD-10-CM | POA: Diagnosis not present

## 2016-07-28 DIAGNOSIS — E559 Vitamin D deficiency, unspecified: Secondary | ICD-10-CM | POA: Diagnosis not present

## 2016-07-29 DIAGNOSIS — D513 Other dietary vitamin B12 deficiency anemia: Secondary | ICD-10-CM | POA: Diagnosis not present

## 2016-07-29 DIAGNOSIS — F0390 Unspecified dementia without behavioral disturbance: Secondary | ICD-10-CM | POA: Diagnosis not present

## 2016-07-29 DIAGNOSIS — I6522 Occlusion and stenosis of left carotid artery: Secondary | ICD-10-CM | POA: Diagnosis not present

## 2016-07-29 DIAGNOSIS — L89623 Pressure ulcer of left heel, stage 3: Secondary | ICD-10-CM | POA: Diagnosis not present

## 2016-07-29 DIAGNOSIS — D509 Iron deficiency anemia, unspecified: Secondary | ICD-10-CM | POA: Diagnosis not present

## 2016-07-29 DIAGNOSIS — I10 Essential (primary) hypertension: Secondary | ICD-10-CM | POA: Diagnosis not present

## 2016-07-31 DIAGNOSIS — I10 Essential (primary) hypertension: Secondary | ICD-10-CM | POA: Diagnosis not present

## 2016-07-31 DIAGNOSIS — D513 Other dietary vitamin B12 deficiency anemia: Secondary | ICD-10-CM | POA: Diagnosis not present

## 2016-07-31 DIAGNOSIS — F0390 Unspecified dementia without behavioral disturbance: Secondary | ICD-10-CM | POA: Diagnosis not present

## 2016-07-31 DIAGNOSIS — D509 Iron deficiency anemia, unspecified: Secondary | ICD-10-CM | POA: Diagnosis not present

## 2016-07-31 DIAGNOSIS — I6522 Occlusion and stenosis of left carotid artery: Secondary | ICD-10-CM | POA: Diagnosis not present

## 2016-07-31 DIAGNOSIS — L89623 Pressure ulcer of left heel, stage 3: Secondary | ICD-10-CM | POA: Diagnosis not present

## 2016-08-02 DIAGNOSIS — F0390 Unspecified dementia without behavioral disturbance: Secondary | ICD-10-CM | POA: Diagnosis not present

## 2016-08-02 DIAGNOSIS — D509 Iron deficiency anemia, unspecified: Secondary | ICD-10-CM | POA: Diagnosis not present

## 2016-08-02 DIAGNOSIS — I6522 Occlusion and stenosis of left carotid artery: Secondary | ICD-10-CM | POA: Diagnosis not present

## 2016-08-02 DIAGNOSIS — D513 Other dietary vitamin B12 deficiency anemia: Secondary | ICD-10-CM | POA: Diagnosis not present

## 2016-08-02 DIAGNOSIS — I10 Essential (primary) hypertension: Secondary | ICD-10-CM | POA: Diagnosis not present

## 2016-08-02 DIAGNOSIS — L89623 Pressure ulcer of left heel, stage 3: Secondary | ICD-10-CM | POA: Diagnosis not present

## 2016-08-04 DIAGNOSIS — D513 Other dietary vitamin B12 deficiency anemia: Secondary | ICD-10-CM | POA: Diagnosis not present

## 2016-08-04 DIAGNOSIS — L89623 Pressure ulcer of left heel, stage 3: Secondary | ICD-10-CM | POA: Diagnosis not present

## 2016-08-04 DIAGNOSIS — I10 Essential (primary) hypertension: Secondary | ICD-10-CM | POA: Diagnosis not present

## 2016-08-04 DIAGNOSIS — I6522 Occlusion and stenosis of left carotid artery: Secondary | ICD-10-CM | POA: Diagnosis not present

## 2016-08-04 DIAGNOSIS — D509 Iron deficiency anemia, unspecified: Secondary | ICD-10-CM | POA: Diagnosis not present

## 2016-08-04 DIAGNOSIS — F0390 Unspecified dementia without behavioral disturbance: Secondary | ICD-10-CM | POA: Diagnosis not present

## 2016-08-07 DIAGNOSIS — D509 Iron deficiency anemia, unspecified: Secondary | ICD-10-CM | POA: Diagnosis not present

## 2016-08-07 DIAGNOSIS — L89623 Pressure ulcer of left heel, stage 3: Secondary | ICD-10-CM | POA: Diagnosis not present

## 2016-08-07 DIAGNOSIS — I6522 Occlusion and stenosis of left carotid artery: Secondary | ICD-10-CM | POA: Diagnosis not present

## 2016-08-07 DIAGNOSIS — F0390 Unspecified dementia without behavioral disturbance: Secondary | ICD-10-CM | POA: Diagnosis not present

## 2016-08-07 DIAGNOSIS — D513 Other dietary vitamin B12 deficiency anemia: Secondary | ICD-10-CM | POA: Diagnosis not present

## 2016-08-07 DIAGNOSIS — I10 Essential (primary) hypertension: Secondary | ICD-10-CM | POA: Diagnosis not present

## 2016-08-09 DIAGNOSIS — M1990 Unspecified osteoarthritis, unspecified site: Secondary | ICD-10-CM | POA: Diagnosis not present

## 2016-08-09 DIAGNOSIS — I6522 Occlusion and stenosis of left carotid artery: Secondary | ICD-10-CM | POA: Diagnosis not present

## 2016-08-09 DIAGNOSIS — E870 Hyperosmolality and hypernatremia: Secondary | ICD-10-CM | POA: Diagnosis not present

## 2016-08-09 DIAGNOSIS — H9190 Unspecified hearing loss, unspecified ear: Secondary | ICD-10-CM | POA: Diagnosis not present

## 2016-08-09 DIAGNOSIS — I10 Essential (primary) hypertension: Secondary | ICD-10-CM | POA: Diagnosis not present

## 2016-08-09 DIAGNOSIS — L89623 Pressure ulcer of left heel, stage 3: Secondary | ICD-10-CM | POA: Diagnosis not present

## 2016-08-09 DIAGNOSIS — E559 Vitamin D deficiency, unspecified: Secondary | ICD-10-CM | POA: Diagnosis not present

## 2016-08-09 DIAGNOSIS — F0390 Unspecified dementia without behavioral disturbance: Secondary | ICD-10-CM | POA: Diagnosis not present

## 2016-08-09 DIAGNOSIS — D513 Other dietary vitamin B12 deficiency anemia: Secondary | ICD-10-CM | POA: Diagnosis not present

## 2016-08-09 DIAGNOSIS — Z9181 History of falling: Secondary | ICD-10-CM | POA: Diagnosis not present

## 2016-08-09 DIAGNOSIS — D509 Iron deficiency anemia, unspecified: Secondary | ICD-10-CM | POA: Diagnosis not present

## 2016-08-11 DIAGNOSIS — D509 Iron deficiency anemia, unspecified: Secondary | ICD-10-CM | POA: Diagnosis not present

## 2016-08-11 DIAGNOSIS — I6522 Occlusion and stenosis of left carotid artery: Secondary | ICD-10-CM | POA: Diagnosis not present

## 2016-08-11 DIAGNOSIS — I10 Essential (primary) hypertension: Secondary | ICD-10-CM | POA: Diagnosis not present

## 2016-08-11 DIAGNOSIS — L89623 Pressure ulcer of left heel, stage 3: Secondary | ICD-10-CM | POA: Diagnosis not present

## 2016-08-11 DIAGNOSIS — F0390 Unspecified dementia without behavioral disturbance: Secondary | ICD-10-CM | POA: Diagnosis not present

## 2016-08-11 DIAGNOSIS — D513 Other dietary vitamin B12 deficiency anemia: Secondary | ICD-10-CM | POA: Diagnosis not present

## 2016-08-14 DIAGNOSIS — D513 Other dietary vitamin B12 deficiency anemia: Secondary | ICD-10-CM | POA: Diagnosis not present

## 2016-08-14 DIAGNOSIS — D509 Iron deficiency anemia, unspecified: Secondary | ICD-10-CM | POA: Diagnosis not present

## 2016-08-14 DIAGNOSIS — I10 Essential (primary) hypertension: Secondary | ICD-10-CM | POA: Diagnosis not present

## 2016-08-14 DIAGNOSIS — I6522 Occlusion and stenosis of left carotid artery: Secondary | ICD-10-CM | POA: Diagnosis not present

## 2016-08-14 DIAGNOSIS — F0390 Unspecified dementia without behavioral disturbance: Secondary | ICD-10-CM | POA: Diagnosis not present

## 2016-08-14 DIAGNOSIS — L89623 Pressure ulcer of left heel, stage 3: Secondary | ICD-10-CM | POA: Diagnosis not present

## 2016-08-16 DIAGNOSIS — I6522 Occlusion and stenosis of left carotid artery: Secondary | ICD-10-CM | POA: Diagnosis not present

## 2016-08-16 DIAGNOSIS — F0390 Unspecified dementia without behavioral disturbance: Secondary | ICD-10-CM | POA: Diagnosis not present

## 2016-08-16 DIAGNOSIS — D513 Other dietary vitamin B12 deficiency anemia: Secondary | ICD-10-CM | POA: Diagnosis not present

## 2016-08-16 DIAGNOSIS — I10 Essential (primary) hypertension: Secondary | ICD-10-CM | POA: Diagnosis not present

## 2016-08-16 DIAGNOSIS — D509 Iron deficiency anemia, unspecified: Secondary | ICD-10-CM | POA: Diagnosis not present

## 2016-08-16 DIAGNOSIS — L89623 Pressure ulcer of left heel, stage 3: Secondary | ICD-10-CM | POA: Diagnosis not present

## 2016-08-18 DIAGNOSIS — L89623 Pressure ulcer of left heel, stage 3: Secondary | ICD-10-CM | POA: Diagnosis not present

## 2016-08-18 DIAGNOSIS — F0390 Unspecified dementia without behavioral disturbance: Secondary | ICD-10-CM | POA: Diagnosis not present

## 2016-08-18 DIAGNOSIS — I10 Essential (primary) hypertension: Secondary | ICD-10-CM | POA: Diagnosis not present

## 2016-08-18 DIAGNOSIS — I6522 Occlusion and stenosis of left carotid artery: Secondary | ICD-10-CM | POA: Diagnosis not present

## 2016-08-18 DIAGNOSIS — D509 Iron deficiency anemia, unspecified: Secondary | ICD-10-CM | POA: Diagnosis not present

## 2016-08-18 DIAGNOSIS — D513 Other dietary vitamin B12 deficiency anemia: Secondary | ICD-10-CM | POA: Diagnosis not present

## 2016-08-21 DIAGNOSIS — D513 Other dietary vitamin B12 deficiency anemia: Secondary | ICD-10-CM | POA: Diagnosis not present

## 2016-08-21 DIAGNOSIS — I6522 Occlusion and stenosis of left carotid artery: Secondary | ICD-10-CM | POA: Diagnosis not present

## 2016-08-21 DIAGNOSIS — F0390 Unspecified dementia without behavioral disturbance: Secondary | ICD-10-CM | POA: Diagnosis not present

## 2016-08-21 DIAGNOSIS — D509 Iron deficiency anemia, unspecified: Secondary | ICD-10-CM | POA: Diagnosis not present

## 2016-08-21 DIAGNOSIS — I10 Essential (primary) hypertension: Secondary | ICD-10-CM | POA: Diagnosis not present

## 2016-08-21 DIAGNOSIS — L89623 Pressure ulcer of left heel, stage 3: Secondary | ICD-10-CM | POA: Diagnosis not present

## 2016-08-25 DIAGNOSIS — I6522 Occlusion and stenosis of left carotid artery: Secondary | ICD-10-CM | POA: Diagnosis not present

## 2016-08-25 DIAGNOSIS — D513 Other dietary vitamin B12 deficiency anemia: Secondary | ICD-10-CM | POA: Diagnosis not present

## 2016-08-25 DIAGNOSIS — D509 Iron deficiency anemia, unspecified: Secondary | ICD-10-CM | POA: Diagnosis not present

## 2016-08-25 DIAGNOSIS — I10 Essential (primary) hypertension: Secondary | ICD-10-CM | POA: Diagnosis not present

## 2016-08-25 DIAGNOSIS — E559 Vitamin D deficiency, unspecified: Secondary | ICD-10-CM | POA: Diagnosis not present

## 2016-08-25 DIAGNOSIS — L89623 Pressure ulcer of left heel, stage 3: Secondary | ICD-10-CM | POA: Diagnosis not present

## 2016-08-25 DIAGNOSIS — F0390 Unspecified dementia without behavioral disturbance: Secondary | ICD-10-CM | POA: Diagnosis not present

## 2016-08-28 DIAGNOSIS — F0390 Unspecified dementia without behavioral disturbance: Secondary | ICD-10-CM | POA: Diagnosis not present

## 2016-08-28 DIAGNOSIS — D509 Iron deficiency anemia, unspecified: Secondary | ICD-10-CM | POA: Diagnosis not present

## 2016-08-28 DIAGNOSIS — L89623 Pressure ulcer of left heel, stage 3: Secondary | ICD-10-CM | POA: Diagnosis not present

## 2016-08-28 DIAGNOSIS — I10 Essential (primary) hypertension: Secondary | ICD-10-CM | POA: Diagnosis not present

## 2016-08-28 DIAGNOSIS — D513 Other dietary vitamin B12 deficiency anemia: Secondary | ICD-10-CM | POA: Diagnosis not present

## 2016-08-28 DIAGNOSIS — I6522 Occlusion and stenosis of left carotid artery: Secondary | ICD-10-CM | POA: Diagnosis not present

## 2016-08-31 ENCOUNTER — Telehealth: Payer: Self-pay

## 2016-08-31 NOTE — Telephone Encounter (Signed)
Pt's daughter in-law called to let us know that patient is in a nursing home and will not be coming to this office for treatment anymore. She asked me to cancel appt for 09/04/16.

## 2016-09-01 DIAGNOSIS — L89623 Pressure ulcer of left heel, stage 3: Secondary | ICD-10-CM | POA: Diagnosis not present

## 2016-09-01 DIAGNOSIS — F0390 Unspecified dementia without behavioral disturbance: Secondary | ICD-10-CM | POA: Diagnosis not present

## 2016-09-01 DIAGNOSIS — D509 Iron deficiency anemia, unspecified: Secondary | ICD-10-CM | POA: Diagnosis not present

## 2016-09-01 DIAGNOSIS — I10 Essential (primary) hypertension: Secondary | ICD-10-CM | POA: Diagnosis not present

## 2016-09-01 DIAGNOSIS — I6522 Occlusion and stenosis of left carotid artery: Secondary | ICD-10-CM | POA: Diagnosis not present

## 2016-09-01 DIAGNOSIS — D513 Other dietary vitamin B12 deficiency anemia: Secondary | ICD-10-CM | POA: Diagnosis not present

## 2016-09-01 NOTE — Telephone Encounter (Signed)
Forward this to [facility] please

## 2016-09-04 ENCOUNTER — Ambulatory Visit: Payer: Medicare Other | Admitting: Internal Medicine

## 2016-09-04 DIAGNOSIS — D513 Other dietary vitamin B12 deficiency anemia: Secondary | ICD-10-CM | POA: Diagnosis not present

## 2016-09-04 DIAGNOSIS — I6522 Occlusion and stenosis of left carotid artery: Secondary | ICD-10-CM | POA: Diagnosis not present

## 2016-09-04 DIAGNOSIS — L89623 Pressure ulcer of left heel, stage 3: Secondary | ICD-10-CM | POA: Diagnosis not present

## 2016-09-04 DIAGNOSIS — F0390 Unspecified dementia without behavioral disturbance: Secondary | ICD-10-CM | POA: Diagnosis not present

## 2016-09-04 DIAGNOSIS — I10 Essential (primary) hypertension: Secondary | ICD-10-CM | POA: Diagnosis not present

## 2016-09-04 DIAGNOSIS — D509 Iron deficiency anemia, unspecified: Secondary | ICD-10-CM | POA: Diagnosis not present

## 2016-09-06 DIAGNOSIS — D513 Other dietary vitamin B12 deficiency anemia: Secondary | ICD-10-CM | POA: Diagnosis not present

## 2016-09-06 DIAGNOSIS — D509 Iron deficiency anemia, unspecified: Secondary | ICD-10-CM | POA: Diagnosis not present

## 2016-09-06 DIAGNOSIS — M1990 Unspecified osteoarthritis, unspecified site: Secondary | ICD-10-CM | POA: Diagnosis not present

## 2016-09-06 DIAGNOSIS — H9190 Unspecified hearing loss, unspecified ear: Secondary | ICD-10-CM | POA: Diagnosis not present

## 2016-09-06 DIAGNOSIS — H9193 Unspecified hearing loss, bilateral: Secondary | ICD-10-CM | POA: Diagnosis not present

## 2016-09-06 DIAGNOSIS — F0390 Unspecified dementia without behavioral disturbance: Secondary | ICD-10-CM | POA: Diagnosis not present

## 2016-09-06 DIAGNOSIS — E870 Hyperosmolality and hypernatremia: Secondary | ICD-10-CM | POA: Diagnosis not present

## 2016-09-06 DIAGNOSIS — I10 Essential (primary) hypertension: Secondary | ICD-10-CM | POA: Diagnosis not present

## 2016-09-06 DIAGNOSIS — L89623 Pressure ulcer of left heel, stage 3: Secondary | ICD-10-CM | POA: Diagnosis not present

## 2016-09-06 DIAGNOSIS — Z4800 Encounter for change or removal of nonsurgical wound dressing: Secondary | ICD-10-CM | POA: Diagnosis not present

## 2016-09-06 DIAGNOSIS — Z9181 History of falling: Secondary | ICD-10-CM | POA: Diagnosis not present

## 2016-09-06 DIAGNOSIS — E559 Vitamin D deficiency, unspecified: Secondary | ICD-10-CM | POA: Diagnosis not present

## 2016-09-06 DIAGNOSIS — I6522 Occlusion and stenosis of left carotid artery: Secondary | ICD-10-CM | POA: Diagnosis not present

## 2016-09-06 DIAGNOSIS — Z7982 Long term (current) use of aspirin: Secondary | ICD-10-CM | POA: Diagnosis not present

## 2016-09-07 DIAGNOSIS — I10 Essential (primary) hypertension: Secondary | ICD-10-CM | POA: Diagnosis not present

## 2016-09-07 DIAGNOSIS — I6522 Occlusion and stenosis of left carotid artery: Secondary | ICD-10-CM | POA: Diagnosis not present

## 2016-09-07 DIAGNOSIS — L89623 Pressure ulcer of left heel, stage 3: Secondary | ICD-10-CM | POA: Diagnosis not present

## 2016-09-07 DIAGNOSIS — F0390 Unspecified dementia without behavioral disturbance: Secondary | ICD-10-CM | POA: Diagnosis not present

## 2016-09-07 DIAGNOSIS — D509 Iron deficiency anemia, unspecified: Secondary | ICD-10-CM | POA: Diagnosis not present

## 2016-09-07 DIAGNOSIS — D513 Other dietary vitamin B12 deficiency anemia: Secondary | ICD-10-CM | POA: Diagnosis not present

## 2016-09-11 DIAGNOSIS — I10 Essential (primary) hypertension: Secondary | ICD-10-CM | POA: Diagnosis not present

## 2016-09-11 DIAGNOSIS — F0390 Unspecified dementia without behavioral disturbance: Secondary | ICD-10-CM | POA: Diagnosis not present

## 2016-09-11 DIAGNOSIS — D509 Iron deficiency anemia, unspecified: Secondary | ICD-10-CM | POA: Diagnosis not present

## 2016-09-11 DIAGNOSIS — I6522 Occlusion and stenosis of left carotid artery: Secondary | ICD-10-CM | POA: Diagnosis not present

## 2016-09-11 DIAGNOSIS — D513 Other dietary vitamin B12 deficiency anemia: Secondary | ICD-10-CM | POA: Diagnosis not present

## 2016-09-11 DIAGNOSIS — L89623 Pressure ulcer of left heel, stage 3: Secondary | ICD-10-CM | POA: Diagnosis not present

## 2016-09-14 DIAGNOSIS — I10 Essential (primary) hypertension: Secondary | ICD-10-CM | POA: Diagnosis not present

## 2016-09-14 DIAGNOSIS — D513 Other dietary vitamin B12 deficiency anemia: Secondary | ICD-10-CM | POA: Diagnosis not present

## 2016-09-14 DIAGNOSIS — F0390 Unspecified dementia without behavioral disturbance: Secondary | ICD-10-CM | POA: Diagnosis not present

## 2016-09-14 DIAGNOSIS — I6522 Occlusion and stenosis of left carotid artery: Secondary | ICD-10-CM | POA: Diagnosis not present

## 2016-09-14 DIAGNOSIS — L89623 Pressure ulcer of left heel, stage 3: Secondary | ICD-10-CM | POA: Diagnosis not present

## 2016-09-14 DIAGNOSIS — D509 Iron deficiency anemia, unspecified: Secondary | ICD-10-CM | POA: Diagnosis not present

## 2016-09-18 DIAGNOSIS — F0390 Unspecified dementia without behavioral disturbance: Secondary | ICD-10-CM | POA: Diagnosis not present

## 2016-09-18 DIAGNOSIS — D513 Other dietary vitamin B12 deficiency anemia: Secondary | ICD-10-CM | POA: Diagnosis not present

## 2016-09-18 DIAGNOSIS — L89623 Pressure ulcer of left heel, stage 3: Secondary | ICD-10-CM | POA: Diagnosis not present

## 2016-09-18 DIAGNOSIS — I10 Essential (primary) hypertension: Secondary | ICD-10-CM | POA: Diagnosis not present

## 2016-09-18 DIAGNOSIS — D509 Iron deficiency anemia, unspecified: Secondary | ICD-10-CM | POA: Diagnosis not present

## 2016-09-18 DIAGNOSIS — I6522 Occlusion and stenosis of left carotid artery: Secondary | ICD-10-CM | POA: Diagnosis not present

## 2016-09-21 DIAGNOSIS — F0390 Unspecified dementia without behavioral disturbance: Secondary | ICD-10-CM | POA: Diagnosis not present

## 2016-09-21 DIAGNOSIS — L89623 Pressure ulcer of left heel, stage 3: Secondary | ICD-10-CM | POA: Diagnosis not present

## 2016-09-21 DIAGNOSIS — I6522 Occlusion and stenosis of left carotid artery: Secondary | ICD-10-CM | POA: Diagnosis not present

## 2016-09-21 DIAGNOSIS — I10 Essential (primary) hypertension: Secondary | ICD-10-CM | POA: Diagnosis not present

## 2016-09-21 DIAGNOSIS — D513 Other dietary vitamin B12 deficiency anemia: Secondary | ICD-10-CM | POA: Diagnosis not present

## 2016-09-21 DIAGNOSIS — D509 Iron deficiency anemia, unspecified: Secondary | ICD-10-CM | POA: Diagnosis not present

## 2016-09-28 DIAGNOSIS — D513 Other dietary vitamin B12 deficiency anemia: Secondary | ICD-10-CM | POA: Diagnosis not present

## 2016-09-28 DIAGNOSIS — L89623 Pressure ulcer of left heel, stage 3: Secondary | ICD-10-CM | POA: Diagnosis not present

## 2016-09-28 DIAGNOSIS — D509 Iron deficiency anemia, unspecified: Secondary | ICD-10-CM | POA: Diagnosis not present

## 2016-09-28 DIAGNOSIS — I10 Essential (primary) hypertension: Secondary | ICD-10-CM | POA: Diagnosis not present

## 2016-09-28 DIAGNOSIS — I6522 Occlusion and stenosis of left carotid artery: Secondary | ICD-10-CM | POA: Diagnosis not present

## 2016-09-28 DIAGNOSIS — F0390 Unspecified dementia without behavioral disturbance: Secondary | ICD-10-CM | POA: Diagnosis not present

## 2016-09-29 DIAGNOSIS — D509 Iron deficiency anemia, unspecified: Secondary | ICD-10-CM | POA: Diagnosis not present

## 2016-09-29 DIAGNOSIS — M1990 Unspecified osteoarthritis, unspecified site: Secondary | ICD-10-CM | POA: Diagnosis not present

## 2016-09-29 DIAGNOSIS — R609 Edema, unspecified: Secondary | ICD-10-CM | POA: Diagnosis not present

## 2016-10-03 DIAGNOSIS — E559 Vitamin D deficiency, unspecified: Secondary | ICD-10-CM | POA: Diagnosis not present

## 2016-10-03 DIAGNOSIS — D513 Other dietary vitamin B12 deficiency anemia: Secondary | ICD-10-CM | POA: Diagnosis not present

## 2016-10-03 DIAGNOSIS — Z7982 Long term (current) use of aspirin: Secondary | ICD-10-CM | POA: Diagnosis not present

## 2016-10-03 DIAGNOSIS — I10 Essential (primary) hypertension: Secondary | ICD-10-CM | POA: Diagnosis not present

## 2016-10-03 DIAGNOSIS — E871 Hypo-osmolality and hyponatremia: Secondary | ICD-10-CM | POA: Diagnosis not present

## 2016-10-03 DIAGNOSIS — H9190 Unspecified hearing loss, unspecified ear: Secondary | ICD-10-CM | POA: Diagnosis not present

## 2016-10-03 DIAGNOSIS — D509 Iron deficiency anemia, unspecified: Secondary | ICD-10-CM | POA: Diagnosis not present

## 2016-10-03 DIAGNOSIS — R609 Edema, unspecified: Secondary | ICD-10-CM | POA: Diagnosis not present

## 2016-10-03 DIAGNOSIS — L89602 Pressure ulcer of unspecified heel, stage 2: Secondary | ICD-10-CM | POA: Diagnosis not present

## 2016-10-03 DIAGNOSIS — Z9181 History of falling: Secondary | ICD-10-CM | POA: Diagnosis not present

## 2016-10-03 DIAGNOSIS — M1990 Unspecified osteoarthritis, unspecified site: Secondary | ICD-10-CM | POA: Diagnosis not present

## 2016-10-03 DIAGNOSIS — R296 Repeated falls: Secondary | ICD-10-CM | POA: Diagnosis not present

## 2016-10-10 DIAGNOSIS — Z9181 History of falling: Secondary | ICD-10-CM | POA: Diagnosis not present

## 2016-10-10 DIAGNOSIS — H9190 Unspecified hearing loss, unspecified ear: Secondary | ICD-10-CM | POA: Diagnosis not present

## 2016-10-10 DIAGNOSIS — D509 Iron deficiency anemia, unspecified: Secondary | ICD-10-CM | POA: Diagnosis not present

## 2016-10-10 DIAGNOSIS — I10 Essential (primary) hypertension: Secondary | ICD-10-CM | POA: Diagnosis not present

## 2016-10-10 DIAGNOSIS — D513 Other dietary vitamin B12 deficiency anemia: Secondary | ICD-10-CM | POA: Diagnosis not present

## 2016-10-10 DIAGNOSIS — E559 Vitamin D deficiency, unspecified: Secondary | ICD-10-CM | POA: Diagnosis not present

## 2016-10-10 DIAGNOSIS — E870 Hyperosmolality and hypernatremia: Secondary | ICD-10-CM | POA: Diagnosis not present

## 2016-10-10 DIAGNOSIS — M1990 Unspecified osteoarthritis, unspecified site: Secondary | ICD-10-CM | POA: Diagnosis not present

## 2016-10-26 DIAGNOSIS — L853 Xerosis cutis: Secondary | ICD-10-CM | POA: Diagnosis not present

## 2016-10-26 DIAGNOSIS — Q845 Enlarged and hypertrophic nails: Secondary | ICD-10-CM | POA: Diagnosis not present

## 2016-10-26 DIAGNOSIS — M2010 Hallux valgus (acquired), unspecified foot: Secondary | ICD-10-CM | POA: Diagnosis not present

## 2016-10-26 DIAGNOSIS — R600 Localized edema: Secondary | ICD-10-CM | POA: Diagnosis not present

## 2016-10-26 DIAGNOSIS — L603 Nail dystrophy: Secondary | ICD-10-CM | POA: Diagnosis not present

## 2016-10-26 DIAGNOSIS — B351 Tinea unguium: Secondary | ICD-10-CM | POA: Diagnosis not present

## 2016-10-26 DIAGNOSIS — I739 Peripheral vascular disease, unspecified: Secondary | ICD-10-CM | POA: Diagnosis not present

## 2016-10-27 DIAGNOSIS — R609 Edema, unspecified: Secondary | ICD-10-CM | POA: Diagnosis not present

## 2016-10-27 DIAGNOSIS — E871 Hypo-osmolality and hyponatremia: Secondary | ICD-10-CM | POA: Diagnosis not present

## 2016-10-27 DIAGNOSIS — Z7982 Long term (current) use of aspirin: Secondary | ICD-10-CM | POA: Diagnosis not present

## 2016-11-08 DIAGNOSIS — H9190 Unspecified hearing loss, unspecified ear: Secondary | ICD-10-CM | POA: Diagnosis not present

## 2016-11-08 DIAGNOSIS — R609 Edema, unspecified: Secondary | ICD-10-CM | POA: Diagnosis not present

## 2016-11-08 DIAGNOSIS — E870 Hyperosmolality and hypernatremia: Secondary | ICD-10-CM | POA: Diagnosis not present

## 2016-11-08 DIAGNOSIS — M1990 Unspecified osteoarthritis, unspecified site: Secondary | ICD-10-CM | POA: Diagnosis not present

## 2016-11-08 DIAGNOSIS — D513 Other dietary vitamin B12 deficiency anemia: Secondary | ICD-10-CM | POA: Diagnosis not present

## 2016-11-08 DIAGNOSIS — Z9181 History of falling: Secondary | ICD-10-CM | POA: Diagnosis not present

## 2016-11-08 DIAGNOSIS — D509 Iron deficiency anemia, unspecified: Secondary | ICD-10-CM | POA: Diagnosis not present

## 2016-11-08 DIAGNOSIS — E559 Vitamin D deficiency, unspecified: Secondary | ICD-10-CM | POA: Diagnosis not present

## 2016-11-15 DIAGNOSIS — Z9181 History of falling: Secondary | ICD-10-CM | POA: Diagnosis not present

## 2016-11-24 DIAGNOSIS — N390 Urinary tract infection, site not specified: Secondary | ICD-10-CM | POA: Diagnosis not present

## 2016-12-08 DIAGNOSIS — R296 Repeated falls: Secondary | ICD-10-CM | POA: Diagnosis not present

## 2016-12-08 DIAGNOSIS — S81802A Unspecified open wound, left lower leg, initial encounter: Secondary | ICD-10-CM | POA: Diagnosis not present

## 2016-12-08 DIAGNOSIS — D509 Iron deficiency anemia, unspecified: Secondary | ICD-10-CM | POA: Diagnosis not present

## 2016-12-10 DIAGNOSIS — M1990 Unspecified osteoarthritis, unspecified site: Secondary | ICD-10-CM | POA: Diagnosis not present

## 2016-12-10 DIAGNOSIS — Z9181 History of falling: Secondary | ICD-10-CM | POA: Diagnosis not present

## 2016-12-10 DIAGNOSIS — E559 Vitamin D deficiency, unspecified: Secondary | ICD-10-CM | POA: Diagnosis not present

## 2016-12-10 DIAGNOSIS — H9190 Unspecified hearing loss, unspecified ear: Secondary | ICD-10-CM | POA: Diagnosis not present

## 2016-12-10 DIAGNOSIS — E870 Hyperosmolality and hypernatremia: Secondary | ICD-10-CM | POA: Diagnosis not present

## 2016-12-10 DIAGNOSIS — R609 Edema, unspecified: Secondary | ICD-10-CM | POA: Diagnosis not present

## 2016-12-10 DIAGNOSIS — D513 Other dietary vitamin B12 deficiency anemia: Secondary | ICD-10-CM | POA: Diagnosis not present

## 2016-12-10 DIAGNOSIS — D509 Iron deficiency anemia, unspecified: Secondary | ICD-10-CM | POA: Diagnosis not present

## 2016-12-13 DIAGNOSIS — D509 Iron deficiency anemia, unspecified: Secondary | ICD-10-CM | POA: Diagnosis not present

## 2016-12-13 DIAGNOSIS — D513 Other dietary vitamin B12 deficiency anemia: Secondary | ICD-10-CM | POA: Diagnosis not present

## 2016-12-13 DIAGNOSIS — E870 Hyperosmolality and hypernatremia: Secondary | ICD-10-CM | POA: Diagnosis not present

## 2016-12-13 DIAGNOSIS — R609 Edema, unspecified: Secondary | ICD-10-CM | POA: Diagnosis not present

## 2016-12-15 DIAGNOSIS — I872 Venous insufficiency (chronic) (peripheral): Secondary | ICD-10-CM | POA: Diagnosis not present

## 2016-12-15 DIAGNOSIS — M150 Primary generalized (osteo)arthritis: Secondary | ICD-10-CM | POA: Diagnosis not present

## 2016-12-15 DIAGNOSIS — Z9181 History of falling: Secondary | ICD-10-CM | POA: Diagnosis not present

## 2016-12-15 DIAGNOSIS — Z4800 Encounter for change or removal of nonsurgical wound dressing: Secondary | ICD-10-CM | POA: Diagnosis not present

## 2016-12-15 DIAGNOSIS — I10 Essential (primary) hypertension: Secondary | ICD-10-CM | POA: Diagnosis not present

## 2016-12-15 DIAGNOSIS — S81802D Unspecified open wound, left lower leg, subsequent encounter: Secondary | ICD-10-CM | POA: Diagnosis not present

## 2016-12-15 DIAGNOSIS — H9190 Unspecified hearing loss, unspecified ear: Secondary | ICD-10-CM | POA: Diagnosis not present

## 2016-12-18 DIAGNOSIS — Z4800 Encounter for change or removal of nonsurgical wound dressing: Secondary | ICD-10-CM | POA: Diagnosis not present

## 2016-12-18 DIAGNOSIS — I10 Essential (primary) hypertension: Secondary | ICD-10-CM | POA: Diagnosis not present

## 2016-12-18 DIAGNOSIS — M150 Primary generalized (osteo)arthritis: Secondary | ICD-10-CM | POA: Diagnosis not present

## 2016-12-18 DIAGNOSIS — H9190 Unspecified hearing loss, unspecified ear: Secondary | ICD-10-CM | POA: Diagnosis not present

## 2016-12-18 DIAGNOSIS — I872 Venous insufficiency (chronic) (peripheral): Secondary | ICD-10-CM | POA: Diagnosis not present

## 2016-12-18 DIAGNOSIS — S81802D Unspecified open wound, left lower leg, subsequent encounter: Secondary | ICD-10-CM | POA: Diagnosis not present

## 2016-12-20 DIAGNOSIS — I10 Essential (primary) hypertension: Secondary | ICD-10-CM | POA: Diagnosis not present

## 2016-12-20 DIAGNOSIS — I872 Venous insufficiency (chronic) (peripheral): Secondary | ICD-10-CM | POA: Diagnosis not present

## 2016-12-20 DIAGNOSIS — H9190 Unspecified hearing loss, unspecified ear: Secondary | ICD-10-CM | POA: Diagnosis not present

## 2016-12-20 DIAGNOSIS — Z4800 Encounter for change or removal of nonsurgical wound dressing: Secondary | ICD-10-CM | POA: Diagnosis not present

## 2016-12-20 DIAGNOSIS — S81802D Unspecified open wound, left lower leg, subsequent encounter: Secondary | ICD-10-CM | POA: Diagnosis not present

## 2016-12-20 DIAGNOSIS — M150 Primary generalized (osteo)arthritis: Secondary | ICD-10-CM | POA: Diagnosis not present

## 2016-12-22 DIAGNOSIS — M150 Primary generalized (osteo)arthritis: Secondary | ICD-10-CM | POA: Diagnosis not present

## 2016-12-22 DIAGNOSIS — I10 Essential (primary) hypertension: Secondary | ICD-10-CM | POA: Diagnosis not present

## 2016-12-22 DIAGNOSIS — I872 Venous insufficiency (chronic) (peripheral): Secondary | ICD-10-CM | POA: Diagnosis not present

## 2016-12-22 DIAGNOSIS — Z4800 Encounter for change or removal of nonsurgical wound dressing: Secondary | ICD-10-CM | POA: Diagnosis not present

## 2016-12-22 DIAGNOSIS — H9190 Unspecified hearing loss, unspecified ear: Secondary | ICD-10-CM | POA: Diagnosis not present

## 2016-12-22 DIAGNOSIS — S81802D Unspecified open wound, left lower leg, subsequent encounter: Secondary | ICD-10-CM | POA: Diagnosis not present

## 2016-12-25 DIAGNOSIS — H9190 Unspecified hearing loss, unspecified ear: Secondary | ICD-10-CM | POA: Diagnosis not present

## 2016-12-25 DIAGNOSIS — M150 Primary generalized (osteo)arthritis: Secondary | ICD-10-CM | POA: Diagnosis not present

## 2016-12-25 DIAGNOSIS — I872 Venous insufficiency (chronic) (peripheral): Secondary | ICD-10-CM | POA: Diagnosis not present

## 2016-12-25 DIAGNOSIS — Z4800 Encounter for change or removal of nonsurgical wound dressing: Secondary | ICD-10-CM | POA: Diagnosis not present

## 2016-12-25 DIAGNOSIS — I10 Essential (primary) hypertension: Secondary | ICD-10-CM | POA: Diagnosis not present

## 2016-12-25 DIAGNOSIS — S81802D Unspecified open wound, left lower leg, subsequent encounter: Secondary | ICD-10-CM | POA: Diagnosis not present

## 2016-12-27 DIAGNOSIS — Z4800 Encounter for change or removal of nonsurgical wound dressing: Secondary | ICD-10-CM | POA: Diagnosis not present

## 2016-12-27 DIAGNOSIS — I10 Essential (primary) hypertension: Secondary | ICD-10-CM | POA: Diagnosis not present

## 2016-12-27 DIAGNOSIS — M150 Primary generalized (osteo)arthritis: Secondary | ICD-10-CM | POA: Diagnosis not present

## 2016-12-27 DIAGNOSIS — H9190 Unspecified hearing loss, unspecified ear: Secondary | ICD-10-CM | POA: Diagnosis not present

## 2016-12-27 DIAGNOSIS — I872 Venous insufficiency (chronic) (peripheral): Secondary | ICD-10-CM | POA: Diagnosis not present

## 2016-12-27 DIAGNOSIS — S81802D Unspecified open wound, left lower leg, subsequent encounter: Secondary | ICD-10-CM | POA: Diagnosis not present

## 2016-12-29 DIAGNOSIS — S81802D Unspecified open wound, left lower leg, subsequent encounter: Secondary | ICD-10-CM | POA: Diagnosis not present

## 2016-12-29 DIAGNOSIS — I872 Venous insufficiency (chronic) (peripheral): Secondary | ICD-10-CM | POA: Diagnosis not present

## 2016-12-29 DIAGNOSIS — H9190 Unspecified hearing loss, unspecified ear: Secondary | ICD-10-CM | POA: Diagnosis not present

## 2016-12-29 DIAGNOSIS — M150 Primary generalized (osteo)arthritis: Secondary | ICD-10-CM | POA: Diagnosis not present

## 2016-12-29 DIAGNOSIS — Z4800 Encounter for change or removal of nonsurgical wound dressing: Secondary | ICD-10-CM | POA: Diagnosis not present

## 2016-12-29 DIAGNOSIS — I10 Essential (primary) hypertension: Secondary | ICD-10-CM | POA: Diagnosis not present

## 2017-01-01 DIAGNOSIS — Z4800 Encounter for change or removal of nonsurgical wound dressing: Secondary | ICD-10-CM | POA: Diagnosis not present

## 2017-01-01 DIAGNOSIS — I10 Essential (primary) hypertension: Secondary | ICD-10-CM | POA: Diagnosis not present

## 2017-01-01 DIAGNOSIS — H9190 Unspecified hearing loss, unspecified ear: Secondary | ICD-10-CM | POA: Diagnosis not present

## 2017-01-01 DIAGNOSIS — M150 Primary generalized (osteo)arthritis: Secondary | ICD-10-CM | POA: Diagnosis not present

## 2017-01-01 DIAGNOSIS — I872 Venous insufficiency (chronic) (peripheral): Secondary | ICD-10-CM | POA: Diagnosis not present

## 2017-01-01 DIAGNOSIS — S81802D Unspecified open wound, left lower leg, subsequent encounter: Secondary | ICD-10-CM | POA: Diagnosis not present

## 2017-01-03 DIAGNOSIS — I872 Venous insufficiency (chronic) (peripheral): Secondary | ICD-10-CM | POA: Diagnosis not present

## 2017-01-03 DIAGNOSIS — S81802D Unspecified open wound, left lower leg, subsequent encounter: Secondary | ICD-10-CM | POA: Diagnosis not present

## 2017-01-03 DIAGNOSIS — H9190 Unspecified hearing loss, unspecified ear: Secondary | ICD-10-CM | POA: Diagnosis not present

## 2017-01-03 DIAGNOSIS — I10 Essential (primary) hypertension: Secondary | ICD-10-CM | POA: Diagnosis not present

## 2017-01-03 DIAGNOSIS — M150 Primary generalized (osteo)arthritis: Secondary | ICD-10-CM | POA: Diagnosis not present

## 2017-01-03 DIAGNOSIS — Z4800 Encounter for change or removal of nonsurgical wound dressing: Secondary | ICD-10-CM | POA: Diagnosis not present

## 2017-01-05 DIAGNOSIS — M150 Primary generalized (osteo)arthritis: Secondary | ICD-10-CM | POA: Diagnosis not present

## 2017-01-05 DIAGNOSIS — N390 Urinary tract infection, site not specified: Secondary | ICD-10-CM | POA: Diagnosis not present

## 2017-01-05 DIAGNOSIS — H9190 Unspecified hearing loss, unspecified ear: Secondary | ICD-10-CM | POA: Diagnosis not present

## 2017-01-05 DIAGNOSIS — Z4800 Encounter for change or removal of nonsurgical wound dressing: Secondary | ICD-10-CM | POA: Diagnosis not present

## 2017-01-05 DIAGNOSIS — I872 Venous insufficiency (chronic) (peripheral): Secondary | ICD-10-CM | POA: Diagnosis not present

## 2017-01-05 DIAGNOSIS — I10 Essential (primary) hypertension: Secondary | ICD-10-CM | POA: Diagnosis not present

## 2017-01-05 DIAGNOSIS — M1990 Unspecified osteoarthritis, unspecified site: Secondary | ICD-10-CM | POA: Diagnosis not present

## 2017-01-05 DIAGNOSIS — S81802D Unspecified open wound, left lower leg, subsequent encounter: Secondary | ICD-10-CM | POA: Diagnosis not present

## 2017-01-07 ENCOUNTER — Emergency Department (HOSPITAL_COMMUNITY)
Admission: EM | Admit: 2017-01-07 | Discharge: 2017-01-07 | Disposition: A | Discharge status: Home / Self Care | Payer: Medicare Other | Attending: Emergency Medicine | Admitting: Emergency Medicine

## 2017-01-07 ENCOUNTER — Emergency Department (HOSPITAL_COMMUNITY): Payer: Medicare Other

## 2017-01-07 ENCOUNTER — Encounter (HOSPITAL_COMMUNITY): Payer: Self-pay

## 2017-01-07 DIAGNOSIS — R059 Cough, unspecified: Secondary | ICD-10-CM

## 2017-01-07 DIAGNOSIS — E860 Dehydration: Secondary | ICD-10-CM | POA: Diagnosis not present

## 2017-01-07 DIAGNOSIS — R05 Cough: Secondary | ICD-10-CM | POA: Insufficient documentation

## 2017-01-07 DIAGNOSIS — R410 Disorientation, unspecified: Secondary | ICD-10-CM | POA: Diagnosis not present

## 2017-01-07 DIAGNOSIS — Z79899 Other long term (current) drug therapy: Secondary | ICD-10-CM | POA: Diagnosis not present

## 2017-01-07 DIAGNOSIS — I10 Essential (primary) hypertension: Secondary | ICD-10-CM | POA: Insufficient documentation

## 2017-01-07 DIAGNOSIS — Z7982 Long term (current) use of aspirin: Secondary | ICD-10-CM | POA: Diagnosis not present

## 2017-01-07 DIAGNOSIS — R4182 Altered mental status, unspecified: Secondary | ICD-10-CM | POA: Diagnosis not present

## 2017-01-07 DIAGNOSIS — R9431 Abnormal electrocardiogram [ECG] [EKG]: Secondary | ICD-10-CM | POA: Diagnosis not present

## 2017-01-07 DIAGNOSIS — N390 Urinary tract infection, site not specified: Secondary | ICD-10-CM | POA: Insufficient documentation

## 2017-01-07 LAB — CBC WITH DIFFERENTIAL/PLATELET
Basophils Absolute: 0 10*3/uL (ref 0.0–0.1)
Basophils Relative: 0 %
Eosinophils Absolute: 0.1 10*3/uL (ref 0.0–0.7)
Eosinophils Relative: 1 %
HEMATOCRIT: 28.3 % — AB (ref 36.0–46.0)
HEMOGLOBIN: 9.6 g/dL — AB (ref 12.0–15.0)
LYMPHS ABS: 3.6 10*3/uL (ref 0.7–4.0)
LYMPHS PCT: 23 %
MCH: 28.7 pg (ref 26.0–34.0)
MCHC: 33.9 g/dL (ref 30.0–36.0)
MCV: 84.7 fL (ref 78.0–100.0)
MONOS PCT: 8 %
Monocytes Absolute: 1.3 10*3/uL — ABNORMAL HIGH (ref 0.1–1.0)
NEUTROS ABS: 11 10*3/uL — AB (ref 1.7–7.7)
NEUTROS PCT: 68 %
Platelets: 224 10*3/uL (ref 150–400)
RBC: 3.34 MIL/uL — ABNORMAL LOW (ref 3.87–5.11)
RDW: 14.3 % (ref 11.5–15.5)
WBC: 15.9 10*3/uL — ABNORMAL HIGH (ref 4.0–10.5)

## 2017-01-07 LAB — COMPREHENSIVE METABOLIC PANEL
ALBUMIN: 2.3 g/dL — AB (ref 3.5–5.0)
ALK PHOS: 150 U/L — AB (ref 38–126)
ALT: 38 U/L (ref 14–54)
ANION GAP: 9 (ref 5–15)
AST: 70 U/L — ABNORMAL HIGH (ref 15–41)
BUN: 20 mg/dL (ref 6–20)
CALCIUM: 8.1 mg/dL — AB (ref 8.9–10.3)
CHLORIDE: 95 mmol/L — AB (ref 101–111)
CO2: 23 mmol/L (ref 22–32)
Creatinine, Ser: 0.77 mg/dL (ref 0.44–1.00)
GFR calc non Af Amer: >60 mL/min (ref >60)
GLUCOSE: 106 mg/dL — AB (ref 65–99)
POTASSIUM: 4.3 mmol/L (ref 3.5–5.1)
SODIUM: 127 mmol/L — AB (ref 135–145)
Total Bilirubin: 0.9 mg/dL (ref 0.3–1.2)
Total Protein: 5.9 g/dL — ABNORMAL LOW (ref 6.5–8.1)

## 2017-01-07 LAB — URINALYSIS, COMPLETE (UACMP) WITH MICROSCOPIC
Bilirubin Urine: NEGATIVE
Glucose, UA: NEGATIVE mg/dL
HGB URINE DIPSTICK: NEGATIVE
Ketones, ur: NEGATIVE mg/dL
Leukocytes, UA: NEGATIVE
NITRITE: NEGATIVE
PROTEIN: NEGATIVE mg/dL
SPECIFIC GRAVITY, URINE: 1.015 (ref 1.005–1.030)
SQUAMOUS EPITHELIAL / LPF: NONE SEEN
pH: 5.5 (ref 5.0–8.0)

## 2017-01-07 LAB — CBG MONITORING, ED: GLUCOSE-CAPILLARY: 99 mg/dL (ref 65–99)

## 2017-01-07 MED ORDER — SODIUM CHLORIDE 0.9 % IV SOLN
INTRAVENOUS | Status: DC
  Administered 2017-01-07: 14:00:00 via INTRAVENOUS

## 2017-01-07 MED ORDER — DEXTROSE 5 % IV SOLN
1.0000 g | Freq: Once | INTRAVENOUS | Status: AC
  Administered 2017-01-07: 1 g via INTRAVENOUS
  Filled 2017-01-07: qty 10

## 2017-01-07 MED ORDER — CEPHALEXIN 500 MG PO CAPS: 500.0000 mg | ORAL_CAPSULE | Freq: Two times a day (BID) | ORAL | 0 refills | Status: AC

## 2017-01-07 MED ORDER — SODIUM CHLORIDE 0.9 % IV BOLUS (SEPSIS)
1000.0000 mL | Freq: Once | INTRAVENOUS | Status: AC
  Administered 2017-01-07: 1000 mL via INTRAVENOUS

## 2017-01-07 NOTE — ED Notes (Signed)
Delay in urine and rectal temp, Pt transported to xray.

## 2017-01-07 NOTE — ED Notes (Signed)
Family states that pt has pain with movement of pt's legs.  Family requesting pain medication.

## 2017-01-07 NOTE — ED Provider Notes (Signed)
5:04 PM Care assumed from Dr. Lowella Dell.   At time of transfer of care, patient is awaiting completion of her IV fluid resuscitation. Patient diagnosed with UTI and was given Rocephin and Keflex. Patient will be discharged home with Rocephin for Keflex. Patient needs to be reassessed for hypoxia and other vital sign abnormalities prior to discharge home. Anticipate discharge if symptoms improve after fluids and antibiotic.  7:12 PM Patient reassessed and was continuing to have a deep cough. Intermittently, patient's oxygen levels were in the 80s. Nursing will readjust the oxygen saturation sensor and patient is still hypoxic, will consider CT imaging to more definitively rule out pneumonia. If patient has new oxygen requirement, she will likely need admission.  Patient demonstrated for several hours that she has no oxygen requirement. She maintain her oxygen saturation on room air.  Based on previous team's plan, patient will be discharged home with antibiotics. Patient was discharged home with Keflex prescription for urinary tract infection. Patient will follow-up with PCP in the next several days. Patient will observe strict return precautions for any new or worsened symptoms. Patient's son understood and agreed with plan of care and patient discharged in good condition.  Clinical Impression: 1. Lower urinary tract infectious disease   2. Cough     Disposition: Discharge  Condition: Good  I have discussed the results, Dx and Tx plan with the pt(& family if present). He/she/they expressed understanding and agree(s) with the plan. Discharge instructions discussed at great length. Strict return precautions discussed and pt &/or family have verbalized understanding of the instructions. No further questions at time of discharge.    Discharge Medication List as of 01/07/2017 10:16 PM    START taking these medications   Details  cephALEXin (KEFLEX) 500 MG capsule Take 1 capsule (500 mg total) by  mouth 2 (two) times daily., Starting Sun 01/07/2017, Until Sun 01/14/2017, Print        Follow Up: Elby Showers, MD [address] [place] [address] [phone]  Schedule an appointment as soon as possible for a visit    [facility] DEPT [place] [id number] [place] [phone]  If symptoms worsen     Tegeler, Gwenyth Allegra, MD 01/08/17 [phone]

## 2017-01-07 NOTE — ED Triage Notes (Signed)
Per EMS, patient comes from [facility] on [address], she is usually alert and oriented x4 but was confused today. The doctor saw her on Friday and ordered a UA, however it was not collected. Patient is incontinent. Pts initial Bps were in the 80s but came up to 110. Pt has anxiety and will scream if she feels "alone". Her son has her apartment key.

## 2017-01-07 NOTE — ED Notes (Signed)
[facility] called for transport.  

## 2017-01-07 NOTE — ED Notes (Signed)
Bed: WA20 Expected date: 01/07/17 Expected time: 1:01 PM Means of arrival: Ambulance Comments: Hypotension

## 2017-01-07 NOTE — ED Provider Notes (Signed)
[facility] DEPT Provider Note   CSN: [id number] Arrival date & time: 01/07/17  1221     History   Chief Complaint Chief Complaint  Patient presents with  . Altered Mental Status    confusion  . Weakness    HPI Brooke Boyd is a 83 y.o. female.  The history is provided by a relative and the EMS personnel.  Altered Mental Status   This is a new problem. The current episode started yesterday. The problem has been gradually worsening. Associated symptoms include confusion and weakness. Risk factors: age. Her past medical history is significant for dementia.  Weakness  Associated symptoms include altered mental status and confusion. Associated medical issues include dementia.    Past Medical History:  Diagnosis Date  . Dependent edema   . Generalized weakness   . Hypertension   . Hyponatremia   . Left carotid stenosis   . Leukocytosis   . Normocytic anemia     Patient Active Problem List   Diagnosis Date Noted  . Generalized weakness 03/22/2016  . Normocytic anemia 03/22/2016  . Hyponatremia 03/22/2016  . Leucocytosis 03/22/2016  . Weakness 03/22/2016  . Unspecified arthropathy, shoulder region 01/09/2013  . Hearing loss 07/04/2011  . Dependent edema 07/04/2011  . Hypertension 01/03/2011  . Osteoarthritis 01/03/2011    Past Surgical History:  Procedure Laterality Date  . ABDOMINAL HYSTERECTOMY    . EYE SURGERY  12/2009   cataract    OB History    No data available       Home Medications    Prior to Admission medications   Medication Sig Start Date End Date Taking? Authorizing Provider  aspirin 81 MG tablet Take 81 mg by mouth daily.     Yes [provider]  Cholecalciferol (VITAMIN D-3) 1000 units CAPS Take 1 capsule by mouth daily.   Yes [provider]  Control Gel Formula Dressing (DUODERM CGF DRESSING EX) Apply to right hip topically every 3 days   Yes [provider]  ferrous sulfate (FE TABS) 325 (65 FE) MG  EC tablet Take 1 tablet (325 mg total) by mouth daily with breakfast. 03/24/16  Yes Lavina Hamman, MD  folic acid (FOLVITE) 1 MG tablet Take 1 tablet (1 mg total) by mouth daily. 03/25/16  Yes Lavina Hamman, MD  furosemide (LASIX) 20 MG tablet Take 1 tablet (20 mg total) by mouth daily as needed for fluid or edema. Take for weight gain of more than 3Lbs in a day or 5Lbs in 2 days. Patient taking differently: Take 20 mg by mouth daily.  03/24/16  Yes Lavina Hamman, MD  Menthol, Topical Analgesic, (BIOFREEZE) 4 % GEL Apply 1 application topically 2 (two) times daily. Apply to right knee and right shoulder   Yes [provider]  NUTRITIONAL SUPPLEMENT LIQD Take 120 mLs by mouth 3 (three) times daily. [employer]   Yes [provider]  polyethylene glycol (MIRALAX / GLYCOLAX) packet Take 17 g by mouth daily as needed for mild constipation. 03/24/16  Yes Lavina Hamman, MD  potassium chloride SA (K-DUR,KLOR-CON) 20 MEQ tablet Take 20 mEq by mouth daily. Administer when giving Lasix to prevent hypokalemia   Yes [provider]  vitamin B-12 (CYANOCOBALAMIN) 500 MCG tablet Take 500 mcg by mouth daily.   Yes [provider]  cephALEXin (KEFLEX) 500 MG capsule Take 1 capsule (500 mg total) by mouth 2 (two) times daily. 01/07/17 01/14/17  Tegeler, Gwenyth Allegra, MD  Family History Family History  Problem Relation Age of Onset  . Heart disease Mother     Social History Social History  Substance Use Topics  . Smoking status: Never Smoker  . Smokeless tobacco: Never Used  . Alcohol use No     Allergies   Tape   Review of Systems Review of Systems  Unable to perform ROS: Dementia  Respiratory: Positive for cough.   Neurological: Positive for weakness.  Psychiatric/Behavioral: Positive for confusion.     Physical Exam Updated Vital Signs BP (!) 147/65   Pulse 98   Temp 98.3 F (36.8 C) (Rectal)   Resp (!) 21   Ht 5\' 3"  (1.6 m)   Wt 45.8 kg (101  lb)   SpO2 94%   BMI 17.89 kg/m   Physical Exam  Constitutional: She appears well-developed and well-nourished.  HENT:  Head: Normocephalic and atraumatic.  Mouth/Throat: Mucous membranes are dry.  Eyes: Conjunctivae and EOM are normal.  Sunken  Neck: Normal range of motion.  Cardiovascular: Normal rate and regular rhythm.   Pulmonary/Chest: Effort normal. No stridor. No respiratory distress. She has rales (and decreased R>L).  Abdominal: Soft. She exhibits no distension.  Musculoskeletal: Normal range of motion. She exhibits no edema or deformity.  Neurological: She is alert. No cranial nerve deficit.  Skin: Skin is warm and dry.  Nursing note and vitals reviewed.    ED Treatments / Results  Labs (all labs ordered are listed, but only abnormal results are displayed) Labs Reviewed  COMPREHENSIVE METABOLIC PANEL - Abnormal; Notable for the following:       Result Value   Sodium 127 (*)    Chloride 95 (*)    Glucose, Bld 106 (*)    Calcium 8.1 (*)    Total Protein 5.9 (*)    Albumin 2.3 (*)    AST 70 (*)    Alkaline Phosphatase 150 (*)    All other components within normal limits  CBC WITH DIFFERENTIAL/PLATELET - Abnormal; Notable for the following:    WBC 15.9 (*)    RBC 3.34 (*)    Hemoglobin 9.6 (*)    HCT 28.3 (*)    Neutro Abs 11.0 (*)    Monocytes Absolute 1.3 (*)    All other components within normal limits  URINALYSIS, COMPLETE (UACMP) WITH MICROSCOPIC - Abnormal; Notable for the following:    Bacteria, UA FEW (*)    All other components within normal limits  CBG MONITORING, ED    EKG  EKG Interpretation  Date/Time:  Sunday January 07 2017 13:26:18 EDT Ventricular Rate:  94 PR Interval:    QRS Duration: 102 QT Interval:  368 QTC Calculation: 461 R Axis:   -134 Text Interpretation:  Sinus or ectopic atrial rhythm Right axis deviation Abnormal R-wave progression, early transition Consider anterior infarct Abnormal T, consider ischemia, lateral leads  Confirmed by Merrily Pew [phone]) on 01/07/2017 4:54:52 PM       Radiology Dg Chest 2 View  Result Date: 01/07/2017 CLINICAL DATA:  Confusion EXAM: CHEST  2 VIEW COMPARISON:  03/22/2016 FINDINGS: Mild eventration of the right hemidiaphragm. No focal consolidation. No pleural effusion or pneumothorax. The heart is normal size. Degenerative changes of the visualized thoracolumbar spine. IMPRESSION: No evidence of acute cardiopulmonary disease. Electronically Signed   By: Julian Hy M.D.   On: 01/07/2017 14:19    Procedures Procedures (including critical care time)  Medications Ordered in ED Medications  sodium chloride 0.9 % bolus 1,000 mL (0  mLs Intravenous Stopped 01/07/17 1513)  sodium chloride 0.9 % bolus 1,000 mL (0 mLs Intravenous Stopped 01/07/17 1513)  sodium chloride 0.9 % bolus 1,000 mL (0 mLs Intravenous Stopped 01/07/17 1620)  cefTRIAXone (ROCEPHIN) 1 g in dextrose 5 % 50 mL IVPB (0 g Intravenous Stopped 01/07/17 1755)     Initial Impression / Assessment and Plan / ED Course  I have reviewed the triage vital signs and the nursing notes.  Pertinent labs & imaging results that were available during my care of the patient were reviewed by me and considered in my medical decision making (see chart for details).    Weakness/confusion/sleepiness. Dehydrated. Infection? Patient improving symptoms with hydration, son states this is similar to previous UTI's and urine is questionable so will treat for UTI. Has had cough, so will use abx that is appropriate for pneumonia as well. Slightly low Na which could also be related. Elevated wbc, incrasing likelihood of infection so will continue hydration and work towards discharge.  At time of discharge, pending the rest of fluids and likely discharge.   Final Clinical Impressions(s) / ED Diagnoses   Final diagnoses:  Lower urinary tract infectious disease  Cough    New Prescriptions Discharge Medication List as of 01/07/2017  10:16 PM    START taking these medications   Details  cephALEXin (KEFLEX) 500 MG capsule Take 1 capsule (500 mg total) by mouth 2 (two) times daily., Starting Sun 01/07/2017, Until Sun 01/14/2017, Print         Hydia Copelin, Corene Cornea, MD 01/08/17 2141

## 2017-01-07 NOTE — ED Notes (Signed)
Placed Pt on [facility], waiting on urine sample.

## 2017-01-07 NOTE — Discharge Instructions (Signed)
Your workup today showed evidence of urinary tract infection. He demonstrated stability with your oxygen levels and we saw no evidence of pneumonia today. Please follow-up with your primary care physician for further management and if any symptoms change or worsen, please return to the nearest emergency department.

## 2017-01-08 DIAGNOSIS — H9190 Unspecified hearing loss, unspecified ear: Secondary | ICD-10-CM | POA: Diagnosis not present

## 2017-01-08 DIAGNOSIS — I10 Essential (primary) hypertension: Secondary | ICD-10-CM | POA: Diagnosis not present

## 2017-01-08 DIAGNOSIS — S81802D Unspecified open wound, left lower leg, subsequent encounter: Secondary | ICD-10-CM | POA: Diagnosis not present

## 2017-01-08 DIAGNOSIS — I872 Venous insufficiency (chronic) (peripheral): Secondary | ICD-10-CM | POA: Diagnosis not present

## 2017-01-08 DIAGNOSIS — M150 Primary generalized (osteo)arthritis: Secondary | ICD-10-CM | POA: Diagnosis not present

## 2017-01-08 DIAGNOSIS — Z4800 Encounter for change or removal of nonsurgical wound dressing: Secondary | ICD-10-CM | POA: Diagnosis not present

## 2017-01-10 DIAGNOSIS — D513 Other dietary vitamin B12 deficiency anemia: Secondary | ICD-10-CM | POA: Diagnosis not present

## 2017-01-10 DIAGNOSIS — E559 Vitamin D deficiency, unspecified: Secondary | ICD-10-CM | POA: Diagnosis not present

## 2017-01-10 DIAGNOSIS — M150 Primary generalized (osteo)arthritis: Secondary | ICD-10-CM | POA: Diagnosis not present

## 2017-01-10 DIAGNOSIS — E870 Hyperosmolality and hypernatremia: Secondary | ICD-10-CM | POA: Diagnosis not present

## 2017-01-10 DIAGNOSIS — Z9181 History of falling: Secondary | ICD-10-CM | POA: Diagnosis not present

## 2017-01-10 DIAGNOSIS — Z4800 Encounter for change or removal of nonsurgical wound dressing: Secondary | ICD-10-CM | POA: Diagnosis not present

## 2017-01-10 DIAGNOSIS — D509 Iron deficiency anemia, unspecified: Secondary | ICD-10-CM | POA: Diagnosis not present

## 2017-01-10 DIAGNOSIS — I10 Essential (primary) hypertension: Secondary | ICD-10-CM | POA: Diagnosis not present

## 2017-01-10 DIAGNOSIS — S81802D Unspecified open wound, left lower leg, subsequent encounter: Secondary | ICD-10-CM | POA: Diagnosis not present

## 2017-01-10 DIAGNOSIS — H9190 Unspecified hearing loss, unspecified ear: Secondary | ICD-10-CM | POA: Diagnosis not present

## 2017-01-10 DIAGNOSIS — I872 Venous insufficiency (chronic) (peripheral): Secondary | ICD-10-CM | POA: Diagnosis not present

## 2017-01-10 DIAGNOSIS — M1990 Unspecified osteoarthritis, unspecified site: Secondary | ICD-10-CM | POA: Diagnosis not present

## 2017-01-10 DIAGNOSIS — R609 Edema, unspecified: Secondary | ICD-10-CM | POA: Diagnosis not present

## 2017-01-12 DIAGNOSIS — H9190 Unspecified hearing loss, unspecified ear: Secondary | ICD-10-CM | POA: Diagnosis not present

## 2017-01-12 DIAGNOSIS — S81802D Unspecified open wound, left lower leg, subsequent encounter: Secondary | ICD-10-CM | POA: Diagnosis not present

## 2017-01-12 DIAGNOSIS — I10 Essential (primary) hypertension: Secondary | ICD-10-CM | POA: Diagnosis not present

## 2017-01-12 DIAGNOSIS — M150 Primary generalized (osteo)arthritis: Secondary | ICD-10-CM | POA: Diagnosis not present

## 2017-01-12 DIAGNOSIS — I872 Venous insufficiency (chronic) (peripheral): Secondary | ICD-10-CM | POA: Diagnosis not present

## 2017-01-12 DIAGNOSIS — Z4800 Encounter for change or removal of nonsurgical wound dressing: Secondary | ICD-10-CM | POA: Diagnosis not present

## 2017-01-15 DIAGNOSIS — Z4800 Encounter for change or removal of nonsurgical wound dressing: Secondary | ICD-10-CM | POA: Diagnosis not present

## 2017-01-15 DIAGNOSIS — I872 Venous insufficiency (chronic) (peripheral): Secondary | ICD-10-CM | POA: Diagnosis not present

## 2017-01-15 DIAGNOSIS — R54 Age-related physical debility: Secondary | ICD-10-CM | POA: Diagnosis not present

## 2017-01-15 DIAGNOSIS — S81802D Unspecified open wound, left lower leg, subsequent encounter: Secondary | ICD-10-CM | POA: Diagnosis not present

## 2017-01-15 DIAGNOSIS — M150 Primary generalized (osteo)arthritis: Secondary | ICD-10-CM | POA: Diagnosis not present

## 2017-01-15 DIAGNOSIS — L8990 Pressure ulcer of unspecified site, unspecified stage: Secondary | ICD-10-CM | POA: Diagnosis not present

## 2017-01-15 DIAGNOSIS — R630 Anorexia: Secondary | ICD-10-CM | POA: Diagnosis not present

## 2017-01-15 DIAGNOSIS — E46 Unspecified protein-calorie malnutrition: Secondary | ICD-10-CM | POA: Diagnosis not present

## 2017-01-15 DIAGNOSIS — G309 Alzheimer's disease, unspecified: Secondary | ICD-10-CM | POA: Diagnosis not present

## 2017-01-15 DIAGNOSIS — I10 Essential (primary) hypertension: Secondary | ICD-10-CM | POA: Diagnosis not present

## 2017-01-15 DIAGNOSIS — R609 Edema, unspecified: Secondary | ICD-10-CM | POA: Diagnosis not present

## 2017-01-15 DIAGNOSIS — I6529 Occlusion and stenosis of unspecified carotid artery: Secondary | ICD-10-CM | POA: Diagnosis not present

## 2017-01-15 DIAGNOSIS — H9190 Unspecified hearing loss, unspecified ear: Secondary | ICD-10-CM | POA: Diagnosis not present

## 2017-01-15 DIAGNOSIS — M2450 Contracture, unspecified joint: Secondary | ICD-10-CM | POA: Diagnosis not present

## 2017-01-27 IMAGING — NM NM BONE 3 PHASE
1 series · 6 of 6 positions shown · non-contrast
Comparison: None.

CLINICAL DATA: Low back pain.Sacro-iliac pain

EXAM:
NUCLEAR MEDICINE 3-PHASE BONE SCAN
TECHNIQUE: Radionuclide angiographic images, immediate static blood pool
images, and 3-hour delayed static images were obtained of the spine
and pelvis after intravenous injection of radiopharmaceutical.
RADIOPHARMACEUTICALS:  25.6 mCi Oc-KKm MDP

[Series 1: flow · 4.14mm/px · 6 of 40 frames shown]
[frame 4/40  full-range]
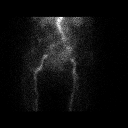
[frame 10/40  full-range]
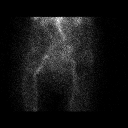
[frame 17/40  full-range]
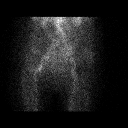
[frame 24/40  full-range]
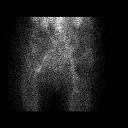
[frame 30/40  full-range]
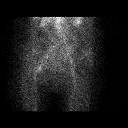
[frame 37/40  full-range]
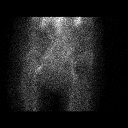

[6 of 6 positions shown; findings below may reference images not displayed]

FINDINGS: Vascular phase: No abnormal areas of increased radiotracer uptake
identified.

Blood pool phase: Physiologic activity seen within the kidneys.
There is no abnormal uptake within the lower lumbar spine or pelvis.

Delayed phase: Advanced arthro pathic changes are noted involving
the right hip. Three areas of linear, heart zonal increased uptake
are identified corresponding to the T12, L1 and L2 vertebra
compatible with compression fractures, age indeterminate.
IMPRESSION: 1. No abnormal increased uptake identified localizing to the sacrum
or iliac joints to explain patient's sacroiliac pain.
2. Advanced arthro pathic changes involve the right hip
3. Increased uptake within the lower thoracic and upper lumbar spine
compatible with chronic compression deformities.

## 2017-02-12 DEATH — deceased

## 2018-03-08 IMAGING — CR DG CHEST 2V
1 series · 1 of 1 positions shown · non-contrast
Comparison: 03/22/2016

CLINICAL DATA: Confusion

EXAM:
CHEST  2 VIEW

[w chest lat]
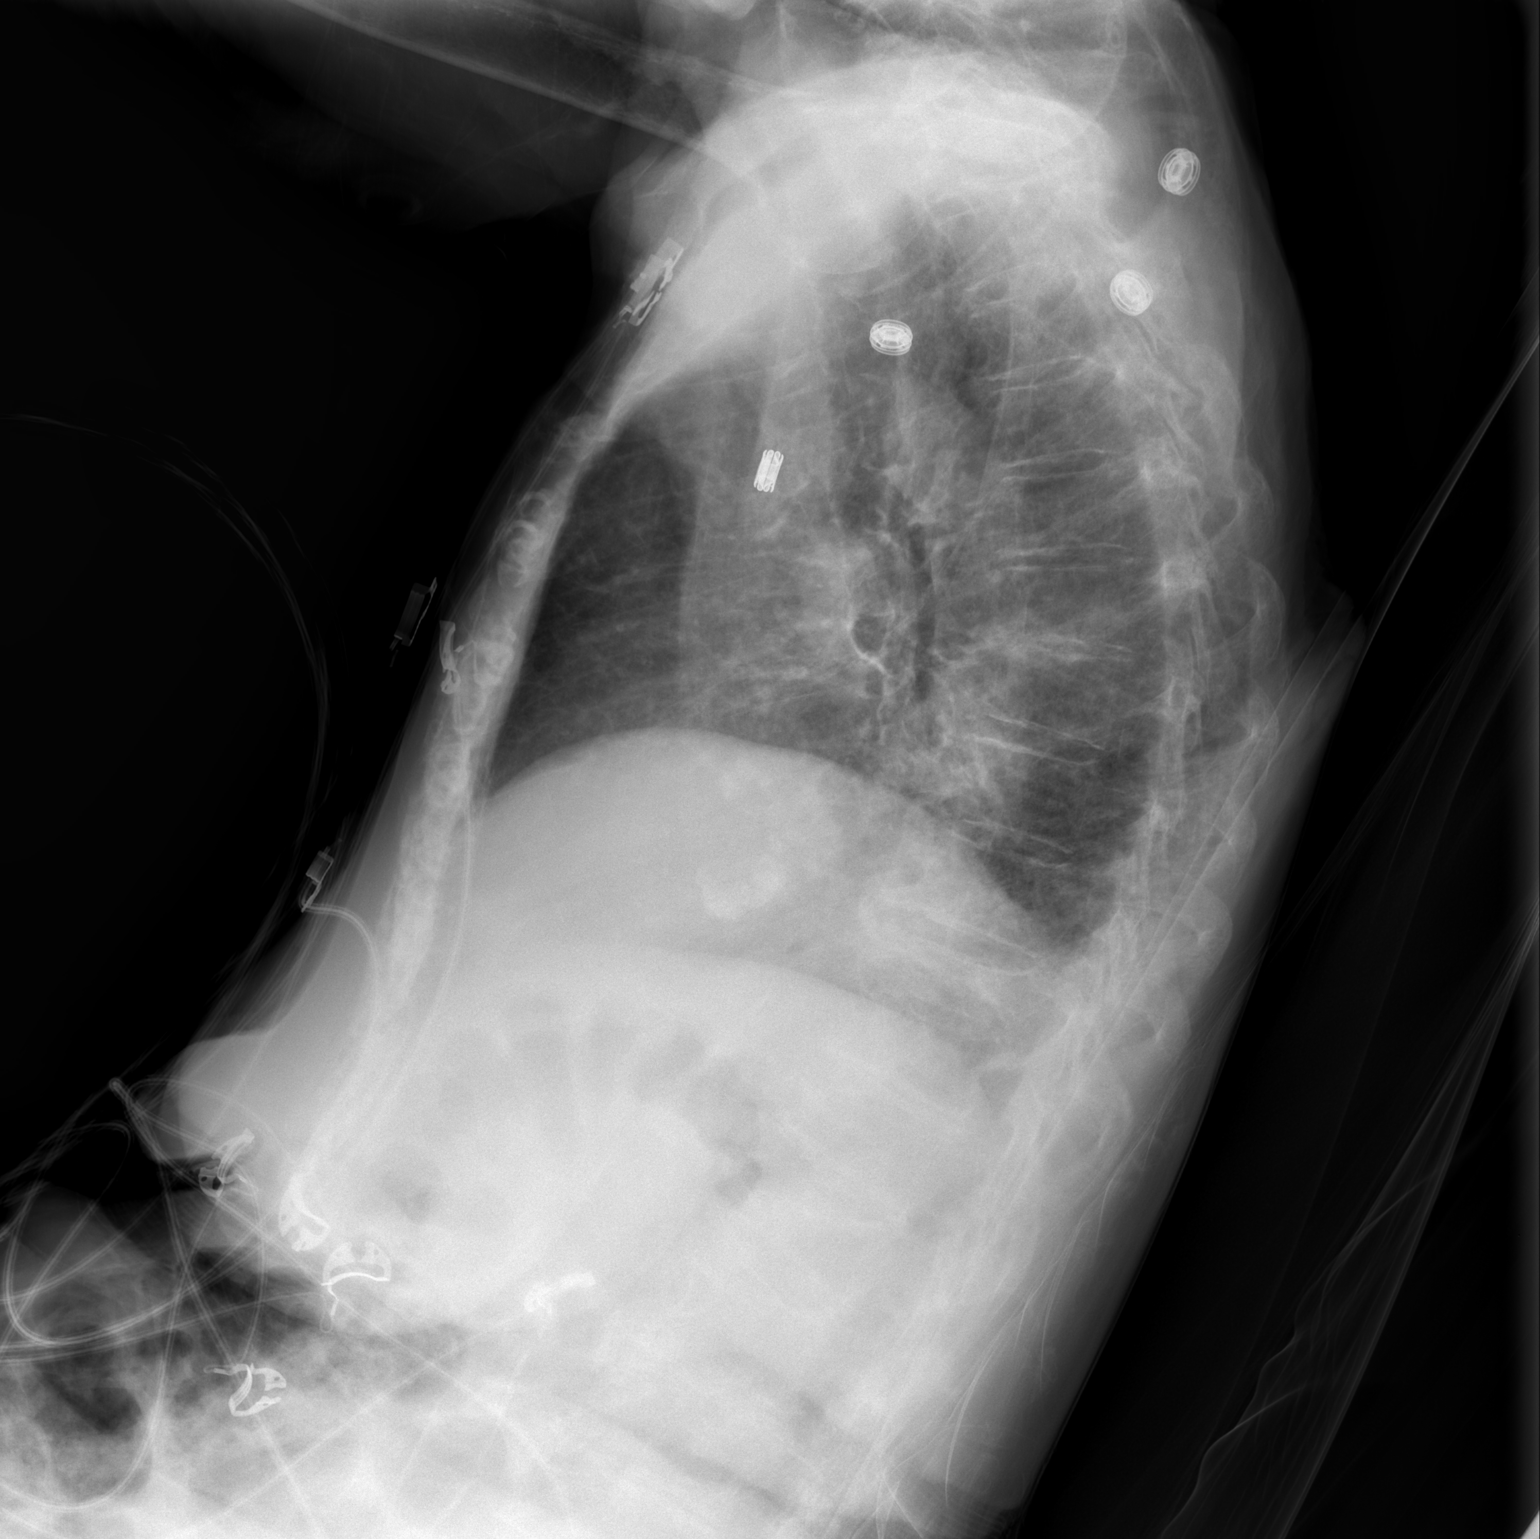

[1 of 1 positions shown; findings below may reference images not displayed]

FINDINGS: Mild eventration of the right hemidiaphragm. No focal consolidation.
No pleural effusion or pneumothorax.

The heart is normal size.

Degenerative changes of the visualized thoracolumbar spine.
IMPRESSION: No evidence of acute cardiopulmonary disease.
